# Patient Record
Sex: Female | Born: 1945 | Race: White | Hispanic: No | Marital: Married | Smoking: Former smoker
Health system: Southern US, Community
[De-identification: ages and names within clinical notes are randomized; demographics above are authoritative.]

## PROBLEM LIST (undated history)

## (undated) DIAGNOSIS — I10 Essential (primary) hypertension: Secondary | ICD-10-CM

## (undated) DIAGNOSIS — C50919 Malignant neoplasm of unspecified site of unspecified female breast: Secondary | ICD-10-CM

## (undated) DIAGNOSIS — Z860100 Personal history of colon polyps, unspecified: Secondary | ICD-10-CM

## (undated) DIAGNOSIS — M1990 Unspecified osteoarthritis, unspecified site: Secondary | ICD-10-CM

## (undated) DIAGNOSIS — K219 Gastro-esophageal reflux disease without esophagitis: Secondary | ICD-10-CM

## (undated) DIAGNOSIS — Z853 Personal history of malignant neoplasm of breast: Secondary | ICD-10-CM

## (undated) DIAGNOSIS — I7300 Raynaud's syndrome without gangrene: Secondary | ICD-10-CM

## (undated) DIAGNOSIS — M8580 Other specified disorders of bone density and structure, unspecified site: Secondary | ICD-10-CM

## (undated) DIAGNOSIS — C801 Malignant (primary) neoplasm, unspecified: Secondary | ICD-10-CM

## (undated) DIAGNOSIS — T7840XA Allergy, unspecified, initial encounter: Secondary | ICD-10-CM

## (undated) HISTORY — PX: DILATION AND CURETTAGE OF UTERUS: SHX78

## (undated) HISTORY — DX: Essential (primary) hypertension: I10

## (undated) HISTORY — DX: Allergy, unspecified, initial encounter: T78.40XA

## (undated) HISTORY — DX: Raynaud's syndrome without gangrene: I73.00

## (undated) HISTORY — PX: MASTECTOMY: SHX3

## (undated) HISTORY — DX: Malignant (primary) neoplasm, unspecified: C80.1

## (undated) HISTORY — PX: TUBAL LIGATION: SHX77

## (undated) HISTORY — DX: Personal history of colon polyps, unspecified: Z86.0100

## (undated) HISTORY — DX: Other specified disorders of bone density and structure, unspecified site: M85.80

## (undated) HISTORY — DX: Personal history of malignant neoplasm of breast: Z85.3

## (undated) HISTORY — PX: POLYPECTOMY: SHX149

## (undated) HISTORY — PX: COLONOSCOPY: SHX174

## (undated) HISTORY — DX: Malignant neoplasm of unspecified site of unspecified female breast: C50.919

## (undated) HISTORY — PX: BREAST SURGERY: SHX581

## (undated) HISTORY — DX: Personal history of colonic polyps: Z86.010

## (undated) HISTORY — DX: Gastro-esophageal reflux disease without esophagitis: K21.9

## (undated) HISTORY — DX: Unspecified osteoarthritis, unspecified site: M19.90

---

## 2003-07-07 LAB — FECAL OCCULT BLOOD, GUAIAC: Fecal Occult Blood: NEGATIVE

## 2004-03-23 ENCOUNTER — Encounter: Payer: Self-pay | Admitting: Internal Medicine

## 2004-07-27 ENCOUNTER — Ambulatory Visit: Payer: Self-pay | Admitting: Internal Medicine

## 2005-02-16 ENCOUNTER — Ambulatory Visit: Payer: Self-pay | Admitting: Internal Medicine

## 2005-03-23 LAB — HM DEXA SCAN

## 2005-04-03 ENCOUNTER — Ambulatory Visit: Payer: Self-pay | Admitting: Gastroenterology

## 2005-04-10 ENCOUNTER — Encounter: Payer: Self-pay | Admitting: Internal Medicine

## 2005-04-17 ENCOUNTER — Ambulatory Visit: Payer: Self-pay | Admitting: Gastroenterology

## 2005-04-17 ENCOUNTER — Encounter (INDEPENDENT_AMBULATORY_CARE_PROVIDER_SITE_OTHER): Payer: Self-pay | Admitting: Specialist

## 2005-10-02 ENCOUNTER — Ambulatory Visit: Payer: Self-pay | Admitting: Internal Medicine

## 2006-04-03 ENCOUNTER — Ambulatory Visit: Payer: Self-pay | Admitting: Internal Medicine

## 2006-10-03 ENCOUNTER — Ambulatory Visit: Payer: Self-pay | Admitting: Internal Medicine

## 2006-12-18 ENCOUNTER — Telehealth (INDEPENDENT_AMBULATORY_CARE_PROVIDER_SITE_OTHER): Payer: Self-pay | Admitting: *Deleted

## 2006-12-19 ENCOUNTER — Ambulatory Visit: Payer: Self-pay | Admitting: Family Medicine

## 2006-12-19 LAB — CONVERTED CEMR LAB
Bilirubin Urine: NEGATIVE
Glucose, Urine, Semiquant: NEGATIVE
Ketones, urine, test strip: NEGATIVE
Nitrite: POSITIVE
pH: 7.5

## 2006-12-24 ENCOUNTER — Telehealth (INDEPENDENT_AMBULATORY_CARE_PROVIDER_SITE_OTHER): Payer: Self-pay | Admitting: *Deleted

## 2006-12-31 ENCOUNTER — Encounter (INDEPENDENT_AMBULATORY_CARE_PROVIDER_SITE_OTHER): Payer: Self-pay | Admitting: Internal Medicine

## 2006-12-31 ENCOUNTER — Ambulatory Visit: Payer: Self-pay | Admitting: Family Medicine

## 2007-01-28 ENCOUNTER — Ambulatory Visit: Payer: Self-pay | Admitting: Internal Medicine

## 2007-01-28 LAB — CONVERTED CEMR LAB
Ketones, urine, test strip: NEGATIVE
Nitrite: POSITIVE

## 2007-03-26 ENCOUNTER — Encounter: Payer: Self-pay | Admitting: Internal Medicine

## 2007-03-26 DIAGNOSIS — Z853 Personal history of malignant neoplasm of breast: Secondary | ICD-10-CM

## 2007-03-26 DIAGNOSIS — M8580 Other specified disorders of bone density and structure, unspecified site: Secondary | ICD-10-CM

## 2007-03-26 DIAGNOSIS — I10 Essential (primary) hypertension: Secondary | ICD-10-CM

## 2007-03-26 DIAGNOSIS — J309 Allergic rhinitis, unspecified: Secondary | ICD-10-CM | POA: Insufficient documentation

## 2007-03-26 DIAGNOSIS — K219 Gastro-esophageal reflux disease without esophagitis: Secondary | ICD-10-CM

## 2007-03-26 DIAGNOSIS — D126 Benign neoplasm of colon, unspecified: Secondary | ICD-10-CM

## 2007-06-10 ENCOUNTER — Ambulatory Visit: Payer: Self-pay | Admitting: Internal Medicine

## 2007-06-11 ENCOUNTER — Encounter: Payer: Self-pay | Admitting: Internal Medicine

## 2007-06-16 ENCOUNTER — Encounter: Payer: Self-pay | Admitting: Internal Medicine

## 2007-06-16 ENCOUNTER — Encounter (INDEPENDENT_AMBULATORY_CARE_PROVIDER_SITE_OTHER): Payer: Self-pay | Admitting: *Deleted

## 2007-06-16 LAB — CONVERTED CEMR LAB: Pap Smear: NORMAL

## 2007-10-20 ENCOUNTER — Telehealth (INDEPENDENT_AMBULATORY_CARE_PROVIDER_SITE_OTHER): Payer: Self-pay | Admitting: *Deleted

## 2008-06-11 ENCOUNTER — Ambulatory Visit: Payer: Self-pay | Admitting: Internal Medicine

## 2008-06-11 DIAGNOSIS — M159 Polyosteoarthritis, unspecified: Secondary | ICD-10-CM | POA: Insufficient documentation

## 2008-06-12 ENCOUNTER — Encounter: Payer: Self-pay | Admitting: Internal Medicine

## 2008-07-19 ENCOUNTER — Ambulatory Visit: Payer: Self-pay | Admitting: Family Medicine

## 2008-07-19 DIAGNOSIS — J019 Acute sinusitis, unspecified: Secondary | ICD-10-CM

## 2008-11-08 ENCOUNTER — Telehealth: Payer: Self-pay | Admitting: Internal Medicine

## 2009-06-13 ENCOUNTER — Ambulatory Visit: Payer: Self-pay | Admitting: Internal Medicine

## 2009-06-13 DIAGNOSIS — R002 Palpitations: Secondary | ICD-10-CM

## 2009-09-27 ENCOUNTER — Ambulatory Visit: Payer: Self-pay | Admitting: Family Medicine

## 2010-03-02 ENCOUNTER — Encounter (INDEPENDENT_AMBULATORY_CARE_PROVIDER_SITE_OTHER): Payer: Self-pay | Admitting: *Deleted

## 2010-06-21 ENCOUNTER — Ambulatory Visit: Payer: Self-pay | Admitting: Internal Medicine

## 2010-08-20 LAB — CONVERTED CEMR LAB
ALT: 23 units/L (ref 0–35)
Bilirubin, Direct: 0.1 mg/dL (ref 0.0–0.3)
Creatinine, Ser: 0.6 mg/dL (ref 0.4–1.2)
Eosinophils Relative: 0.8 % (ref 0.0–5.0)
GFR calc non Af Amer: 106.95 mL/min (ref >60)
Glucose, Bld: 87 mg/dL (ref 70–99)
Lymphocytes Relative: 25.5 % (ref 12.0–46.0)
MCV: 98.3 fL (ref 78.0–100.0)
Monocytes Absolute: 0.5 10*3/uL (ref 0.1–1.0)
Monocytes Relative: 7.6 % (ref 3.0–12.0)
Neutrophils Relative %: 65.6 % (ref 43.0–77.0)
Phosphorus: 3.9 mg/dL (ref 2.3–4.6)
Platelets: 194 10*3/uL (ref 150.0–400.0)
Sodium: 139 meq/L (ref 135–145)
Total Bilirubin: 0.8 mg/dL (ref 0.3–1.2)
WBC: 6.3 10*3/uL (ref 4.5–10.5)

## 2010-08-23 NOTE — Assessment & Plan Note (Signed)
Summary: SINUS PROBLEMS   Vital Signs:  Patient profile:   65 year old female Height:      62 inches Weight:      120.8 pounds BMI:     22.17 Temp:     98.8 degrees F tympanic Pulse rate:   96 / minute Pulse rhythm:   regular BP sitting:   150 / 80  (left arm) Cuff size:   regular  Vitals Entered By: Benny Lennert CMA Duncan Dull) (September 27, 2009 2:17 PM)  History of Present Illness: Chief complaint sinus problem  65 year old female:  1 month  Feels like hurting a lot.  lot of nose congestion and now it has changed so that will have a lot of pain and congestion on one side.     Acute Visit History:      The patient complains of headache, nasal discharge, and sinus problems.  These symptoms began 4 weeks ago.  She denies chest pain, cough, earache, eye symptoms, fever, musculoskeletal symptoms, and nausea.        She complains of sinus pressure, nasal congestion, purulent drainage, and frontal headache.  The patient has had a past history of sinusitis.        Urine output has been normal.  She is tolerating clear liquids.        Allergies: 1)  ! Sulfa 2)  ! * Premarin Cream  Past History:  Past medical, surgical, family and social histories (including risk factors) reviewed, and no changes noted (except as noted below).  Past Medical History: Reviewed history from 06/11/2008 and no changes required. Allergic rhinitis Breast cancer, hx of GERD Hypertension Osteopenia Colonic polyps, hx of-adenomatous Osteoarthritis  Past Surgical History: Reviewed history from 03/26/2007 and no changes required. Mastectomy, right Mastectomy, left, prophylactic Tubal ligation D & C's DEXA- osteopenia 09/06  Family History: Reviewed history from 06/10/2007 and no changes required. Osteoporosis in Mom and sister Mom died in 2005--CVA, ?aspiration Dad died of lung cancer @48  1 sister  Social History: Reviewed history from 03/26/2007 and no changes required. Marital Status:  Married Children: 2 Occupation: Print production planner Former Smoker--quit 1998 Alcohol use-yes  Review of Systems       REVIEW OF SYSTEMS GEN: Acute illness details above. CV: No chest pain or SOB GI: No noted N or V Otherwise, pertinent positives and negatives are noted in the HPI.   Physical Exam  Additional Exam:  Gen: WDWN, NAD; alert,appropriate and cooperative throughout exam  HEENT: Normocephalic and atraumatic. Throat clear, w/o exudate, no LAD, R TM clear, L TM - good landmarks, No fluid present. rhinnorhea.  Left frontal and maxillary sinuses: Tender, max, mild Right frontal and maxillary sinuses: Tender, max, mild  Neck: No ant or post LAD  CV: RRR, No M/G/R  Pulm: Breathing comfortably in no resp distress. no w/c/r  Abd: S,NT,ND,+BS  Extr: no c/c/e  Psych: full affect, pleasant    Impression & Recommendations:  Problem # 1:  SINUSITIS - ACUTE-NOS (ICD-461.9) Assessment New Please see the patient instructions for a detailed list of plans and what was discussed with the patient.   Her updated medication list for this problem includes:    Amoxicillin 500 Mg Tabs (Amoxicillin) .Marland KitchenMarland KitchenMarland KitchenMarland Kitchen 3 tabs by mouth two times a day (high dose sinusitis dosing)  Complete Medication List: 1)  Aspir-low 81 Mg Tbec (Aspirin) .Marland Kitchen.. 1 once daily 2)  Prinzide 10-12.5 Mg Tabs (Lisinopril-hydrochlorothiazide) .Marland Kitchen.. 1 daily by mouth 3)  Multivitamins Tabs (Multiple vitamin) .Marland KitchenMarland KitchenMarland Kitchen  Take 1 tablet by mouth once a day 4)  Vitamin D 400 Unit Tabs (Cholecalciferol) .... Take 2  by mouth once a day 5)  Tums E-x 750 Mg Chew (Calcium carbonate antacid) .... 2 two times a day 6)  Macrodantin 100 Mg Caps (Nitrofurantoin macrocrystal) .... Take one after sex 7)  Ibuprofen 200 Mg Tabs (Ibuprofen) .... 2 tablet at bedtime 8)  Amoxicillin 500 Mg Tabs (Amoxicillin) .... 3 tabs by mouth two times a day (high dose sinusitis dosing)  Patient Instructions: 1)  SINUSITIS 2)  Sinuses are cavities in facial  skeleton that drain to nose. Impaired drainage and obstruction of sinus passages main cause. 3)  Treatment: 4)  1. Take all Antibiotics 5)  3. Steam inhalation 6)  4. Humidifier in room 7)  5. Frequent nasal saline irrigation 8)  6. Moist heat compresses to face 9)  7. Tylenol or Ibuprofen for pain and fever, follow directions on bottle.  Prescriptions: AMOXICILLIN 500 MG TABS (AMOXICILLIN) 3 tabs by mouth two times a day (high dose sinusitis dosing)  #60 x 0   Entered and Authorized by:   Hannah Beat MD   Signed by:   Hannah Beat MD on 09/27/2009   Method used:   Electronically to        [facility] [employer] [phone]* (retail)       [address] [place], [place]  [address]       Ph: [phone] or [phone]       Fax: [phone]   RxID:   [id number]   Current Allergies (reviewed today): ! SULFA ! * PREMARIN CREAM

## 2010-08-23 NOTE — Letter (Signed)
Summary: Colonoscopy Letter  [facility]  [address] [place], [place] [address]   Phone: [phone]  Fax: [phone]      March 02, 2010 MRN: [id number]   Karen Ross [address] [place], [place]  [address]   Dear Ms. Weygandt,   According to your medical record, it is time for you to schedule a Colonoscopy. The American Cancer Society recommends this procedure as a method to detect early colon cancer. Patients with a family history of colon cancer, or a personal history of colon polyps or inflammatory bowel disease are at increased risk.  This letter has beeen generated based on the recommendations made at the time of your procedure. If you feel that in your particular situation this may no longer apply, please contact our office.  Please call our office at [phone] to schedule this appointment or to update your records at your earliest convenience.  Thank you for cooperating with [place] to provide you with the very best care possible.   Sincerely,  Judie Petit T. Russella Dar, M.D.  [facility] Gastroenterology Division [phone]

## 2010-08-23 NOTE — Assessment & Plan Note (Signed)
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 Sulfa 2)  ! * Premarin Cream  Past History:  Past medical, surgical, family and social histories (including risk factors) reviewed for relevance to current acute and chronic problems.  Past Medical History: Reviewed history from 06/11/2008 and no changes required. Allergic rhinitis Breast cancer, hx of GERD Hypertension Osteopenia Colonic polyps, hx of-adenomatous Osteoarthritis  Past Surgical History: Reviewed history from 03/26/2007 and no changes required. Mastectomy, right Mastectomy, left, prophylactic Tubal ligation D & C's DEXA- osteopenia 09/06  Family History: Reviewed history from 06/10/2007 and no changes required. Osteoporosis in Mom and sister Mom died in 2005--CVA, ?aspiration Dad died of lung cancer @48  1 sister  Social History: Reviewed history from 03/26/2007 and no changes required. Marital Status: Married Children: 2 Occupation: Print production planner Former Smoker--quit 1998 Alcohol use-yes  Review of Systems General:  weight is stable generally sleeps okay wears seat belt. Eyes:  Complains of blurring; denies double vision and vision loss-1 eye; needs glasses for driving. ENT:  Denies decreased hearing and ringing in ears; teeth okay--regular with dentist. CV:  Complains of lightheadness and palpitations; denies chest pain or discomfort,  difficulty breathing at night, difficulty breathing while lying down, fainting, and shortness of breath with exertion; gets palpitations if she awakens suddenly No problems with exercise Occ mild dizzy sensation--brief only. Resp:  Denies cough and shortness of breath. GI:  Denies abdominal pain, bloody stools, change in bowel habits, dark tarry stools, indigestion, nausea, and vomiting. GU:  Denies dysuria and incontinence; no sexual problems. MS:  Complains of joint pain; denies joint swelling; still with right hip pain--esp if lying on that side--uses 2 ibuprofen at bedtime. Derm:  Denies lesion(s) and rash. Neuro:  Complains of numbness; denies headaches, tingling, and weakness; occ brief numbness in left hand. Psych:  Denies anxiety and depression; some frustrations with husband. Heme:  Denies abnormal bruising and enlarge lymph nodes. Allergy:  Complains of seasonal allergies and sneezing; some seasonal allergies--no meds.  Physical Exam  General:  alert and normal appearance.   Eyes:  pupils equal, pupils round, pupils reactive to light, and no optic disk abnormalities.   Ears:  R ear normal and L ear normal.   Mouth:  no erythema, no exudates, and no lesions.   Neck:  supple, no masses, no thyromegaly, no carotid bruits, and no cervical lymphadenopathy.   Breasts:  no exam since no breast tissue Lungs:  normal respiratory effort, no intercostal retractions, no accessory muscle use, and normal breath sounds.   Heart:  normal rate, regular rhythm, no murmur, and no gallop.   Abdomen:  soft, non-tender, and no masses.   Msk:  no joint tenderness and no joint swelling.   Pulses:  1+ in feet Extremities:  no edema Neurologic:  alert & oriented X3, strength normal in  all extremities, and gait normal.   Skin:  no rashes and no suspicious lesions.   Axillary Nodes:  No palpable lymphadenopathy Psych:  normally interactive, good eye contact, not anxious appearing, and not depressed  appearing.     Impression & Recommendations:  Problem # 1:  PREVENTIVE HEALTH CARE (ICD-V70.0) Assessment Comment Only generally healthy some stress with husband---she doesn't have persistent mood issues or think counselling will help her due for colon--she will call next year for appt--has already gotten letter  Problem # 2:  HYPERTENSION (ICD-401.9) Assessment: Unchanged  always high here ??white coat she will check in store increase if abnormal urine microal  Her updated medication list for this problem includes:    Prinzide 10-12.5 Mg Tabs (Lisinopril-hydrochlorothiazide) .Marland Kitchen... 1 daily by mouth  BP today: 160/100 Prior BP: 150/80 (09/27/2009)  Labs Reviewed: K+: 4.1 (06/13/2009) Creat: : 0.6 (06/13/2009)     Orders: Venipuncture ([address]) Specimen Handling (60454) T-Renal Function Panel [phone]) T-Hepatic Function [phone]) T-TSH (57846-96295) T-Urine Microalbumin w/creat. ratio [phone]) T-CBC w/Diff (53664-40347)  Problem # 3:  OSTEOPENIA (ICD-733.90) Assessment: Unchanged uses tums for calcium  Her updated medication list for this problem includes:    Vitamin D 400 Unit Tabs (Cholecalciferol) .Marland Kitchen... Take 2  by mouth once a day  Complete Medication List: 1)  Prinzide 10-12.5 Mg Tabs (Lisinopril-hydrochlorothiazide) .Marland Kitchen.. 1 daily by mouth 2)  Aspir-low 81 Mg Tbec (Aspirin) .Marland Kitchen.. 1 once daily 3)  Multivitamins Tabs (Multiple vitamin) .... Take 1 tablet by mouth once a day 4)  Vitamin D 400 Unit Tabs (Cholecalciferol) .... Take 2  by mouth once a day 5)  Tums E-x 750 Mg Chew (Calcium carbonate antacid) .... 2 two times a day 6)  Ibuprofen 200 Mg Tabs (Ibuprofen) .... 2 tablet at bedtime  Other Orders: Pneumococcal Vaccine (42595) Admin 1st Vaccine (63875) Flu Vaccine 20yrs + MEDICARE PATIENTS (I4332) Administration Flu vaccine - MCR (R5188)  Patient Instructions: 1)  Please call for your colonoscopy appt after the New Year as you had  planned 2)  Please check your blood pressure every 1-2 months and write it down 3)  Please schedule a follow-up appointment in 6 months .    Orders Added: 1)  Est. Patient 65& > [99397] 2)  Venipuncture [41660] 3)  Pneumococcal Vaccine [90732] 4)  Admin 1st Vaccine [90471] 5)  Flu Vaccine 20yrs + MEDICARE PATIENTS [Q2039] 6)  Administration Flu vaccine - MCR [G0008] 7)  Specimen Handling [99000] 8)  T-Renal Function Panel [80069-22950] 9)  T-Hepatic Function [80076-22960] 10)  T-TSH [63016-01093] 11)  T-Urine Microalbumin w/creat. ratio [[id number]] 12)  T-CBC w/Diff [23557-32202]   Immunizations Administered:  Pneumonia Vaccine:    Vaccine Type: Pneumovax (Medicare)    Site: left deltoid    Mfr: Merck    Dose: 0.5 ml    Route: IM    Given by: Delilah Shan CMA (AAMA)    Exp. Date: 11/17/2011    Lot #: [id number]    VIS given: 06/27/09 version given June 21, 2010.   Immunizations Administered:  Pneumonia Vaccine:    Vaccine Type: Pneumovax (Medicare)    Site: left deltoid    Mfr: Merck    Dose: 0.5 ml    Route: IM    Given by: Delilah Shan CMA (AAMA)    Exp. Date: 11/17/2011    Lot #: [id number]    VIS given: 06/27/09 version given June 21, 2010.  Current Allergies (reviewed today): ! SULFA ! * PREMARIN CREAM   Flu Vaccine  Consent Questions     Do you have a history of severe allergic reactions to this vaccine? no    Any prior history of allergic reactions to egg and/or gelatin? no    Do you have a sensitivity to the preservative Thimersol? no    Do you have a past history of Guillan-Barre Syndrome? no    Do you currently have an acute febrile illness? no    Have you ever had a severe reaction to latex? no    Vaccine information given and explained to patient? yes    Are you currently pregnant? no    Lot Number:[id number]   Exp Date:01/20/2011   Site Given  Left Deltoid IMmedflu Lugene Fuquay CMA (AAMA)  June 21, 2010 10:34 AM

## 2010-08-23 NOTE — Letter (Signed)
Summary: Nature conservation officer [employer] Wellness Visit Questionnaire   [facility] [employer] Annual Wellness Visit Questionnaire   Imported By: Beau Fanny 06/21/2010 15:07:04  _____________________________________________________________________  External Attachment:    Type:   Image     Comment:   External Document

## 2010-09-21 ENCOUNTER — Encounter (INDEPENDENT_AMBULATORY_CARE_PROVIDER_SITE_OTHER): Payer: Self-pay | Admitting: *Deleted

## 2010-09-28 NOTE — Letter (Signed)
Summary: Pre Visit Letter Revised  [facility]  [address] [place], [place] [address]   Phone: [phone]  Fax: [phone]        09/21/2010 MRN: [id number] CATORI PANOZZO [address] [place], [place]  [address]             Procedure Date:  November 15, 2010             recall col Dr Russella Dar   Welcome to the Gastroenterology Division at [facility].    You are scheduled to see a nurse for your pre-procedure visit on November 01, 2010 at 11:00am on the 3rd floor at [facility], [address]  We ask that you try to arrive at our office 15 minutes prior to your appointment time to allow for check-in.  Please take a minute to review the attached form.  If you answer "Yes" to one or more of the questions on the first page, we ask that you call the person listed at your earliest opportunity.  If you answer "No" to all of the questions, please complete the rest of the form and bring it to your appointment.    Your nurse visit will consist of discussing your medical and surgical history, your immediate family medical history, and your medications.   If you are unable to list all of your medications on the form, please bring the medication bottles to your appointment and we will list them.  We will need to be aware of both prescribed and over the counter drugs.  We will need to know exact dosage information as well.    Please be prepared to read and sign documents such as consent forms, a financial agreement, and acknowledgement forms.  If necessary, and with your consent, a friend or relative is welcome to sit-in on the nurse visit with you.  Please bring your insurance card so that we may make a copy of it.  If your insurance requires a referral to see a specialist, please bring your referral form from your primary care physician.  No co-pay is required for this nurse visit.     If you cannot keep your appointment, please call [phone] to cancel or reschedule  prior to your appointment date.  This allows [place] the opportunity to schedule an appointment for another patient in need of care.    Thank you for choosing  Gastroenterology for your medical needs.  We appreciate the opportunity to care for you.  Please visit [place] at our website  to learn more about our practice.  Sincerely, The Gastroenterology Division

## 2010-11-01 ENCOUNTER — Ambulatory Visit (AMBULATORY_SURGERY_CENTER): Payer: 59 | Admitting: *Deleted

## 2010-11-01 VITALS — Ht 62.0 in | Wt 117.2 lb

## 2010-11-01 DIAGNOSIS — Z86010 Personal history of colonic polyps: Secondary | ICD-10-CM

## 2010-11-01 MED ORDER — PEG-KCL-NACL-NASULF-NA ASC-C 100 G PO SOLR: ORAL | Status: DC

## 2010-11-08 ENCOUNTER — Telehealth: Payer: Self-pay | Admitting: Gastroenterology

## 2010-11-14 ENCOUNTER — Encounter: Payer: Self-pay | Admitting: Gastroenterology

## 2010-11-15 ENCOUNTER — Encounter: Payer: Self-pay | Admitting: Gastroenterology

## 2010-11-15 ENCOUNTER — Ambulatory Visit (AMBULATORY_SURGERY_CENTER): Payer: 59 | Admitting: Gastroenterology

## 2010-11-15 DIAGNOSIS — K5730 Diverticulosis of large intestine without perforation or abscess without bleeding: Secondary | ICD-10-CM

## 2010-11-15 DIAGNOSIS — D126 Benign neoplasm of colon, unspecified: Secondary | ICD-10-CM

## 2010-11-15 DIAGNOSIS — Z86010 Personal history of colonic polyps: Secondary | ICD-10-CM

## 2010-11-15 DIAGNOSIS — Z1211 Encounter for screening for malignant neoplasm of colon: Secondary | ICD-10-CM

## 2010-11-15 LAB — HM COLONOSCOPY

## 2010-11-15 MED ORDER — SODIUM CHLORIDE 0.9 % IV SOLN: 500.0000 mL | INTRAVENOUS | Status: DC

## 2010-11-15 NOTE — Patient Instructions (Signed)
Discharged instructions given with verbal understanding. Handouts on polyps, diverticulosis, and hemorrhoids given. Resume previous medications. 

## 2010-11-16 ENCOUNTER — Telehealth: Payer: Self-pay

## 2010-11-16 NOTE — Telephone Encounter (Signed)

## 2010-11-20 ENCOUNTER — Encounter: Payer: Self-pay | Admitting: Gastroenterology

## 2010-12-05 NOTE — Assessment & Plan Note (Signed)
[facility]                                 ON-CALL NOTE   NAME:MILLERBeyza, Bellino                         MRN:          [id number]  DATE:12/17/2006                            DOB:          November 16, 1945    TIME:  5:41 p.m.   PHONE NUMBER:  [phone].   REGULAR DOCTOR:  Dr. Alphonsus Sias.   CHIEF COMPLAINT:  UTI.   The patient states she used to have a lot of bladder infections in the  past.  She had a prophylactic antibiotic that she would have filled now  and then, but she thinks it may have been as much as 8 years ago that  she has had to have that filled.  She called her pharmacy to try to find  out what it was and it was so long ago they could not tell her.  She  said she started having a little bit of burning on urination yesterday;  it got worse today and now she has some blood in her urine, which is  looking pink and milky in color.  She denies fever, nausea or vomiting.  She thinks she might have brought this on by having intercourse  recently, which does bring on her urinary tract infection occasionally.  She has drank a lot of water with no improvement in her symptoms.  I  told her given the fact that her symptoms are so severe and she does  have blood in her urine, to go on to urgent care or the emergency room  and get it checked out now and that is what she is going to do.     Marne A. Tower, MD  Electronically Signed    MAT/MedQ  DD: 12/17/2006  DT: 12/18/2006  Job #: [id number]   cc:   Karie Schwalbe, MD

## 2010-12-14 NOTE — Telephone Encounter (Signed)
Patient has had procedure.

## 2010-12-20 ENCOUNTER — Ambulatory Visit: Payer: Self-pay | Admitting: Internal Medicine

## 2010-12-22 ENCOUNTER — Ambulatory Visit: Payer: Self-pay | Admitting: Internal Medicine

## 2010-12-28 ENCOUNTER — Encounter: Payer: Self-pay | Admitting: Internal Medicine

## 2010-12-29 ENCOUNTER — Encounter: Payer: Self-pay | Admitting: Internal Medicine

## 2010-12-29 ENCOUNTER — Ambulatory Visit (INDEPENDENT_AMBULATORY_CARE_PROVIDER_SITE_OTHER): Payer: 59 | Admitting: Internal Medicine

## 2010-12-29 VITALS — BP 152/80 | HR 75 | Temp 98.8°F | Ht 62.0 in | Wt 117.0 lb

## 2010-12-29 DIAGNOSIS — M899 Disorder of bone, unspecified: Secondary | ICD-10-CM

## 2010-12-29 DIAGNOSIS — I10 Essential (primary) hypertension: Secondary | ICD-10-CM

## 2010-12-29 DIAGNOSIS — M1990 Unspecified osteoarthritis, unspecified site: Secondary | ICD-10-CM

## 2010-12-29 MED ORDER — LISINOPRIL-HYDROCHLOROTHIAZIDE 20-12.5 MG PO TABS: 1.0000 | ORAL_TABLET | Freq: Every day | ORAL | Status: DC

## 2010-12-29 NOTE — Assessment & Plan Note (Signed)
On calcium and vitamin D Discussed daily walking or other exercise

## 2010-12-29 NOTE — Assessment & Plan Note (Signed)
Does fine with nightly ibuprofen Discussed every other day with aspirin then

## 2010-12-29 NOTE — Assessment & Plan Note (Signed)
BP Readings from Last 3 Encounters:  12/29/10 152/80  11/15/10 195/90  06/21/10 160/100   Has been borderline at home Will increase the lisinopril

## 2010-12-29 NOTE — Progress Notes (Signed)
Subjective:    Patient ID: Karen Ross, female    DOB: 07-Jan-1946, 65 y.o.   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 aspirin 81 MG EC tablet Take 81 mg by mouth daily.        . calcium carbonate (TUMS EX) 750 MG chewable tablet Chew 2 tablets by mouth 2 (two) times daily.        . Ergocalciferol (VITAMIN D2) 400 UNITS TABS Take 400 Units by mouth daily. Takes 2 daily       . ibuprofen (ADVIL,MOTRIN) 200 MG tablet Take 200 mg by mouth at bedtime.        Marland Kitchen lisinopril-hydrochlorothiazide (PRINZIDE,ZESTORETIC) 10-12.5 MG per tablet Take 1 tablet by mouth daily.        . Multiple Vitamins-Minerals (MULTIVITAMIN WITH MINERALS) tablet Take 1 tablet by mouth daily.        Marland Kitchen DISCONTD: peg 3350 powder (MOVIPREP) 100 G SOLR Movi prep as directed  1 kit  0   Current Facility-Administered Medications on File Prior to Visit  Medication Dose Route Frequency Provider Last Rate Last Dose  . DISCONTD: 0.9 %  sodium chloride infusion  500 mL Intravenous Continuous Meryl Dare, MD,FACG       Past Medical History  Diagnosis Date  . Arthritis   . Cancer   . Hypertension   . Breast cancer     in 1977  . Allergy   . History of breast cancer   . GERD (gastroesophageal reflux disease)   . Osteopenia   . Hx of colonic polyps     Past Surgical History  Procedure Date  . Colonoscopy   . Polypectomy   . Mastectomy     radial rt.side/tissue removal on left  . Tubal ligation   . Dilation and curettage of uterus     Family History  Problem Relation Age of Onset  . Osteoporosis Mother   . Osteoporosis Sister     History   Social History  .  Marital Status: Married    Spouse Name: N/A    Number of Children: 2  . 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 Smoking status: Former Smoker    Quit date: 08/02/1996  . Smokeless tobacco: Never Used  . Alcohol Use: 7.5 oz/week    15 drink(s) per week  . Drug Use: No  . Sexually Active: Not on file   Other Topics Concern  . Not on file   Social History Narrative  . No narrative on file   Review of Systems Chronic aching in hands---uses ibuprofen 400mg  at bedtime Sleeps okay in general--occ night awakening Not depressed but frustrated with husband at times    Objective:   Physical Exam  Constitutional: She appears well-developed and well-nourished. No distress.  Neck: Normal range of motion. Neck supple. No thyromegaly present.  Cardiovascular: Normal rate, regular rhythm and normal heart sounds.  Exam reveals no gallop.   No murmur heard. Pulmonary/Chest: Effort normal and breath sounds normal. No respiratory distress. She has no wheezes. She has no rales.  Abdominal: Soft. She exhibits  no mass. There is no tenderness.  Musculoskeletal: Normal range of motion. She exhibits no edema and no tenderness.  Lymphadenopathy:    She has no cervical adenopathy.  Psychiatric: She has a normal mood and affect. Her behavior is normal. Judgment and thought content normal.          Assessment & Plan:

## 2010-12-29 NOTE — Patient Instructions (Signed)
Please set up blood work in about 4-6 weeks (met B--401.9)

## 2011-01-31 ENCOUNTER — Other Ambulatory Visit: Payer: 59

## 2011-02-06 ENCOUNTER — Other Ambulatory Visit: Payer: Self-pay | Admitting: Internal Medicine

## 2011-02-06 DIAGNOSIS — I10 Essential (primary) hypertension: Secondary | ICD-10-CM

## 2011-02-07 ENCOUNTER — Other Ambulatory Visit (INDEPENDENT_AMBULATORY_CARE_PROVIDER_SITE_OTHER): Payer: 59 | Admitting: Internal Medicine

## 2011-02-07 ENCOUNTER — Ambulatory Visit (INDEPENDENT_AMBULATORY_CARE_PROVIDER_SITE_OTHER): Payer: 59 | Admitting: Internal Medicine

## 2011-02-07 DIAGNOSIS — Z23 Encounter for immunization: Secondary | ICD-10-CM

## 2011-02-07 DIAGNOSIS — Z2911 Encounter for prophylactic immunotherapy for respiratory syncytial virus (RSV): Secondary | ICD-10-CM

## 2011-02-07 DIAGNOSIS — I10 Essential (primary) hypertension: Secondary | ICD-10-CM

## 2011-02-07 NOTE — Progress Notes (Signed)
  Subjective:    Patient ID: Karen Ross, female    DOB: 12/18/1945, 65 y.o.   MRN: [id number]  HPI  Here for zostavax  Review of Systems     Objective:   Physical Exam        Assessment & Plan:

## 2011-05-25 ENCOUNTER — Encounter: Payer: Self-pay | Admitting: Internal Medicine

## 2011-05-25 ENCOUNTER — Ambulatory Visit (INDEPENDENT_AMBULATORY_CARE_PROVIDER_SITE_OTHER): Payer: 59 | Admitting: Internal Medicine

## 2011-05-25 VITALS — BP 174/100 | HR 89 | Temp 98.5°F | Ht 62.0 in | Wt 117.0 lb

## 2011-05-25 DIAGNOSIS — Z0000 Encounter for general adult medical examination without abnormal findings: Secondary | ICD-10-CM

## 2011-05-25 DIAGNOSIS — I10 Essential (primary) hypertension: Secondary | ICD-10-CM

## 2011-05-25 DIAGNOSIS — Z23 Encounter for immunization: Secondary | ICD-10-CM

## 2011-05-25 DIAGNOSIS — K219 Gastro-esophageal reflux disease without esophagitis: Secondary | ICD-10-CM

## 2011-05-25 MED ORDER — LISINOPRIL-HYDROCHLOROTHIAZIDE 20-12.5 MG PO TABS: 2.0000 | ORAL_TABLET | Freq: Every day | ORAL | Status: DC

## 2011-05-25 NOTE — Assessment & Plan Note (Signed)
Discussed using H2blocker before spicy food

## 2011-05-25 NOTE — Assessment & Plan Note (Signed)
BP Readings from Last 3 Encounters:  05/25/11 174/100  12/29/10 152/80  11/15/10 195/90   Poor control Will double her med See back in 6 weeks

## 2011-05-25 NOTE — Progress Notes (Signed)
Addended by: Alvina Chou on: 05/25/2011 11:16 AM   Modules accepted: Orders

## 2011-05-25 NOTE — Patient Instructions (Signed)
Please take 2 of your blood pressure pills daily. It is fine to take them at the same time

## 2011-05-25 NOTE — Assessment & Plan Note (Signed)
Pap done today---last one if normal Breast implant on left---no obvious breast tissue Discussed fitness Got flu shot

## 2011-05-25 NOTE — Progress Notes (Signed)
Subjective:    Patient ID: Karen Ross, female    DOB: 1946/04/15, 65 y.o.   MRN: [id number]  HPI Doing well No concerns except knows BP has been up a little Hasn't been checking but can feel it a little  Had colonoscopy Due for last Pap today Still has a little left residual breast tissue---not enough for mammo  Current Outpatient Prescriptions on File Prior to Visit  Medication Sig Dispense Refill  . aspirin 81 MG EC tablet Take 81 mg by mouth daily.        . Ergocalciferol (VITAMIN D2) 400 UNITS TABS Take 400 Units by mouth daily. Takes 2 daily       . lisinopril-hydrochlorothiazide (PRINZIDE,ZESTORETIC) 20-12.5 MG per tablet Take 1 tablet by mouth daily.  90 tablet  3  . Multiple Vitamins-Minerals (MULTIVITAMIN WITH MINERALS) tablet Take 1 tablet by mouth daily.          Allergies  Allergen Reactions  . Sulfonamide Derivatives     REACTION: UNSPECIFIED    Past Medical History  Diagnosis Date  . Arthritis   . Cancer   . Hypertension   . Breast cancer     in 1977  . Allergy   . History of breast cancer   . GERD (gastroesophageal reflux disease)   . Osteopenia   . Hx of colonic polyps     Past Surgical History  Procedure Date  . Colonoscopy   . Polypectomy   . Mastectomy     radial rt.side/tissue removal on left  . Tubal ligation   . Dilation and curettage of uterus     Family History  Problem Relation Age of Onset  . Osteoporosis Mother   . Osteoporosis Sister     History   Social History  . Marital Status: Married    Spouse Name: N/A    Number of Children: 2  . 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 Smoking status: Former Smoker    Quit date: 08/02/1996  . Smokeless tobacco: Never Used  . Alcohol Use: 7.5 oz/week    15 drink(s) per week  . Drug Use: No  . Sexually Active: Not on file   Other Topics Concern  . Not on file   Social History Narrative  . No narrative on  file   Review of Systems  Constitutional: Negative for fatigue and unexpected weight change.       Wears seat belt  HENT: Positive for congestion, rhinorrhea and dental problem. Negative for hearing loss and tinnitus.        Did have ears cleared out by ENT Needs dental work for cracked teeth  Eyes: Negative for visual disturbance.       No diplopia or unilateral vision loss  Respiratory: Negative for cough, chest tightness and shortness of breath.   Cardiovascular: Positive for palpitations and leg swelling. Negative for chest pain.       Occ left foot swelling Feels like her heart is beating hard at times---can hear it when left ear is covered  Gastrointestinal: Negative for nausea, vomiting, abdominal pain, constipation and blood in stool.       Occ heartburn with spicy foods--uses tums  Genitourinary: Positive for dyspareunia. Negative for dysuria and difficulty urinating.       Still with some dryness--lubricants make it worse  Musculoskeletal: Positive for back pain and arthralgias. Negative for joint swelling.  Occ low back pain and hand pain  Skin: Negative for rash.       No suspicious areas  Neurological: Positive for numbness and headaches. Negative for dizziness, syncope and weakness.       Occ left hand tingling after awakening--goes away quickly occ headaches---thinks it is sinus  Hematological: Negative for adenopathy. Bruises/bleeds easily.  Psychiatric/Behavioral: Positive for sleep disturbance. Negative for dysphoric mood. The patient is not nervous/anxious.        Has sporadic bad nights for sleep---no general daytime somnolence       Objective:   Physical Exam  Constitutional: She is oriented to person, place, and time. She appears well-developed and well-nourished. No distress.  HENT:  Head: Normocephalic and atraumatic.  Right Ear: External ear normal.  Left Ear: External ear normal.  Mouth/Throat: Oropharynx is clear and moist. No oropharyngeal  exudate.       TMs normal  Eyes: Conjunctivae and EOM are normal. Pupils are equal, round, and reactive to light.  Neck: Normal range of motion. Neck supple. No thyromegaly present.  Cardiovascular: Normal rate, regular rhythm, normal heart sounds and intact distal pulses.  Exam reveals no gallop.   No murmur heard. Pulmonary/Chest: Effort normal and breath sounds normal. No respiratory distress. She has no wheezes. She has no rales.  Abdominal: Soft. She exhibits no mass. There is no tenderness.  Genitourinary:       Slight residual left breast tissue without mass--insert is firm  Vaginal stenosis---normal cervix, pap done Limited bimanual (1 finger) was negative  Musculoskeletal: Normal range of motion. She exhibits no edema and no tenderness.  Lymphadenopathy:    She has no cervical adenopathy.    She has no axillary adenopathy.  Neurological: She is alert and oriented to person, place, and time.  Skin: Skin is warm. No rash noted.  Psychiatric: She has a normal mood and affect. Her behavior is normal. Judgment and thought content normal.          Assessment & Plan:

## 2011-05-31 ENCOUNTER — Encounter: Payer: Self-pay | Admitting: Internal Medicine

## 2011-06-04 ENCOUNTER — Encounter: Payer: Self-pay | Admitting: *Deleted

## 2011-06-05 ENCOUNTER — Encounter: Payer: Self-pay | Admitting: *Deleted

## 2011-06-11 ENCOUNTER — Telehealth: Payer: Self-pay | Admitting: *Deleted

## 2011-06-11 NOTE — Telephone Encounter (Signed)
If congestion is mostly in head, I am not a big fan of guaifenisin Can use vick's vaporub for congestion safely I also recommend acetaminophen

## 2011-06-11 NOTE — Telephone Encounter (Signed)
Pt asks what she can take for cold and sinus symptoms, she takes BP medicine.  Advised mucous relief expectorant is good for cough and chest congestion, use plain, dont take anything with a decongestant.

## 2011-06-11 NOTE — Telephone Encounter (Signed)
Spoke with patient and advised results   

## 2011-06-15 ENCOUNTER — Encounter: Payer: Self-pay | Admitting: Internal Medicine

## 2011-06-25 ENCOUNTER — Telehealth: Payer: Self-pay | Admitting: Internal Medicine

## 2011-06-26 ENCOUNTER — Telehealth: Payer: Self-pay | Admitting: *Deleted

## 2011-06-26 NOTE — Telephone Encounter (Signed)
Message copied by Sueanne Margarita on Tue Jun 26, 2011  3:48 PM ------      Message from: Tillman Abide I      Created: Mon Jun 18, 2011  1:59 PM       Please call      The liver tests are still elevated but have come down quite a bit      The hepatitis tests are all negative so no evidence of a viral hepatitis      We will just plan to repeat the liver levels at her next visit

## 2011-06-26 NOTE — Telephone Encounter (Signed)
.  left message to have patient return my call.  

## 2011-06-27 ENCOUNTER — Telehealth: Payer: Self-pay | Admitting: Internal Medicine

## 2011-06-27 NOTE — Telephone Encounter (Signed)
Informed patient of her lab results.

## 2011-07-09 ENCOUNTER — Encounter: Payer: Self-pay | Admitting: Internal Medicine

## 2011-07-09 ENCOUNTER — Ambulatory Visit (INDEPENDENT_AMBULATORY_CARE_PROVIDER_SITE_OTHER): Payer: 59 | Admitting: Internal Medicine

## 2011-07-09 VITALS — BP 160/90 | HR 80 | Temp 100.7°F | Wt 114.8 lb

## 2011-07-09 DIAGNOSIS — I10 Essential (primary) hypertension: Secondary | ICD-10-CM

## 2011-07-09 DIAGNOSIS — R6889 Other general symptoms and signs: Secondary | ICD-10-CM | POA: Insufficient documentation

## 2011-07-09 NOTE — Assessment & Plan Note (Signed)
Probably attentuated flu since she got the vaccine Other viral illness otherwise Discussed supportive care---ibuprofen for fever and aches, etc

## 2011-07-09 NOTE — Progress Notes (Signed)
Subjective:    Patient ID: Karen Ross, female    DOB: 1946-06-12, 65 y.o.   MRN: [id number]  HPI Has felt terrible since yesterday Fever Lots of cough--dry mostly, occ slight mucus Aching everywhere Did take flu shot  Some headache No ear pain Some sore throat from the coughing  No BP checks at home No problems with the double dose of meds Some increased thirst at times No throat problems until yesterday No chest pain No SOB  occ left arm numbness if she sleeps on it Aches at times also  Current Outpatient Prescriptions on File Prior to Visit  Medication Sig Dispense Refill  . aspirin 81 MG EC tablet Take 81 mg by mouth daily.        . Ergocalciferol (VITAMIN D2) 400 UNITS TABS Take 400 Units by mouth daily. Takes 2 daily       . lisinopril-hydrochlorothiazide (PRINZIDE,ZESTORETIC) 20-12.5 MG per tablet Take 2 tablets by mouth daily.  90 tablet  6  . Multiple Vitamins-Minerals (MULTIVITAMIN WITH MINERALS) tablet Take 1 tablet by mouth daily.          Allergies  Allergen Reactions  . Sulfonamide Derivatives     REACTION: UNSPECIFIED    Past Medical History  Diagnosis Date  . Arthritis   . Cancer   . Hypertension   . Breast cancer     in 1977  . Allergy   . History of breast cancer   . GERD (gastroesophageal reflux disease)   . Osteopenia   . Hx of colonic polyps     Past Surgical History  Procedure Date  . Colonoscopy   . Polypectomy   . Mastectomy     radial rt.side/tissue removal on left  . Tubal ligation   . Dilation and curettage of uterus     Family History  Problem Relation Age of Onset  . Osteoporosis Mother   . Osteoporosis Sister     History   Social History  . Marital Status: Married    Spouse Name: N/A    Number of Children: 2  . 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 Smoking status: Former Smoker    Quit date: 08/02/1996  . Smokeless tobacco: Never Used  .  Alcohol Use: 7.5 oz/week    15 drink(s) per week  . Drug Use: No  . Sexually Active: Not on file   Other Topics Concern  . Not on file   Social History Narrative  . No narrative on file      Review of Systems No rash No vomiting or diarrhea Able to eat but taste is off    Objective:   Physical Exam  Constitutional: She appears well-developed and well-nourished. No distress.       Appears mildly under the weather but no distress  HENT:  Right Ear: External ear normal.  Left Ear: External ear normal.  Mouth/Throat: Oropharynx is clear and moist. No oropharyngeal exudate.       Moderate nasal congestion and inflammation No sinus tenderness  Neck: Normal range of motion. Neck supple. No thyromegaly present.  Cardiovascular: Normal rate, regular rhythm and normal heart sounds.  Exam reveals no gallop.   No murmur heard. Pulmonary/Chest: Effort normal and breath sounds normal. No respiratory distress. She has no wheezes. She has no rales.  Musculoskeletal: She exhibits no edema and no tenderness.  Lymphadenopathy:    She has no cervical  adenopathy.  Psychiatric: She has a normal mood and affect. Her behavior is normal. Judgment and thought content normal.          Assessment & Plan:

## 2011-07-09 NOTE — Assessment & Plan Note (Signed)
BP Readings from Last 3 Encounters:  07/09/11 160/90  05/25/11 174/100  12/29/10 152/80   Repeat by me on right 162/82 Clearly is better Not fair to judge when she has flu-like illness Will check labs and recheck in 3 months

## 2011-07-10 LAB — BASIC METABOLIC PANEL
BUN/Creatinine Ratio: 14 (ref 11–26)
Chloride: 92 mmol/L — ABNORMAL LOW (ref 97–108)
GFR calc Af Amer: 111 mL/min/{1.73_m2} (ref >59)
GFR calc non Af Amer: 96 mL/min/{1.73_m2} (ref >59)
Glucose: 131 mg/dL — ABNORMAL HIGH (ref 65–99)
Potassium: 3.9 mmol/L (ref 3.5–5.2)
Sodium: 130 mmol/L — ABNORMAL LOW (ref 134–144)

## 2011-07-12 ENCOUNTER — Telehealth: Payer: Self-pay | Admitting: Internal Medicine

## 2011-07-12 MED ORDER — AMOXICILLIN 500 MG PO TABS: 500.0000 mg | ORAL_TABLET | Freq: Two times a day (BID) | ORAL | Status: DC

## 2011-07-12 NOTE — Telephone Encounter (Signed)
Spoke with patient and advised results rx sent to pharmacy by e-script  

## 2011-07-12 NOTE — Telephone Encounter (Signed)
Patient was seen in office on 12.17.12 and was told she had a mild case of the flu and now it has become progressively worse.  Fever, congestion, cough with dark brownish and grayish and sore throat she is up all night.  Wanted to if you could Rx her something for congestion and cough.

## 2011-07-12 NOTE — Telephone Encounter (Signed)
Okay to send Rx for amoxicillin 500mg  2 tabs bid x 10 days #40 x 0

## 2011-07-25 ENCOUNTER — Telehealth: Payer: Self-pay | Admitting: Internal Medicine

## 2011-07-25 NOTE — Telephone Encounter (Signed)
Patient was dx with the flu on the 17th and she was Rx Amoxicillan and it took care of the congestion.  But her complaint is her legs and neck is aches.  She states sometimes she can't get out of bed and wanted to know what she can do other than take Ibuprofen.

## 2011-07-25 NOTE — Telephone Encounter (Signed)
Spoke with patient and advised results   

## 2011-07-25 NOTE — Telephone Encounter (Signed)
Can take tylenol or ibuprofen (could try alleve instead of ibuprofen if she wants) Unfortunately there is not much else that can help with the aches other than time

## 2011-08-01 NOTE — Telephone Encounter (Signed)
Opened in error by Pam Clement 

## 2011-08-09 ENCOUNTER — Ambulatory Visit (INDEPENDENT_AMBULATORY_CARE_PROVIDER_SITE_OTHER): Payer: 59 | Admitting: Internal Medicine

## 2011-08-09 ENCOUNTER — Encounter: Payer: Self-pay | Admitting: Internal Medicine

## 2011-08-09 VITALS — BP 128/70 | HR 62 | Temp 98.5°F | Ht 62.0 in | Wt 109.0 lb

## 2011-08-09 DIAGNOSIS — R609 Edema, unspecified: Secondary | ICD-10-CM | POA: Insufficient documentation

## 2011-08-09 DIAGNOSIS — I10 Essential (primary) hypertension: Secondary | ICD-10-CM

## 2011-08-09 DIAGNOSIS — R21 Rash and other nonspecific skin eruption: Secondary | ICD-10-CM

## 2011-08-09 MED ORDER — FUROSEMIDE 20 MG PO TABS: 20.0000 mg | ORAL_TABLET | Freq: Two times a day (BID) | ORAL | Status: DC

## 2011-08-09 NOTE — Progress Notes (Signed)
Subjective:    Patient ID: Karen Ross, female    DOB: March 30, 1946, 66 y.o.   MRN: [id number]  HPI Finally got over the respiratory illness Actually was in bed for a prolonged time She thinks amoxil helped  Now with swollen ankles Started a week ago or so Right first---now both No recent travel No increase in salt intake No injuries May have been just sitting and less active when sick---but this ended when the swelling began Has had considerable pain--mostly at end of the day  No fever No redness or inflammation  Has rash on legs that started with the resp illness  Current Outpatient Prescriptions on File Prior to Visit  Medication Sig Dispense Refill  . aspirin 81 MG EC tablet Take 81 mg by mouth daily.        . calcium carbonate (TUMS - DOSED IN MG ELEMENTAL CALCIUM) 500 MG chewable tablet Chew 2 tablets by mouth 2 (two) times daily.        . Ergocalciferol (VITAMIN D2) 400 UNITS TABS Take 400 Units by mouth daily. Takes 2 daily       . lisinopril-hydrochlorothiazide (PRINZIDE,ZESTORETIC) 20-12.5 MG per tablet Take 2 tablets by mouth daily.  90 tablet  6  . Multiple Vitamins-Minerals (MULTIVITAMIN WITH MINERALS) tablet Take 1 tablet by mouth daily.          Allergies  Allergen Reactions  . Sulfonamide Derivatives     REACTION: UNSPECIFIED    Past Medical History  Diagnosis Date  . Arthritis   . Cancer   . Hypertension   . Breast cancer     in 1977  . Allergy   . History of breast cancer   . GERD (gastroesophageal reflux disease)   . Osteopenia   . Hx of colonic polyps     Past Surgical History  Procedure Date  . Colonoscopy   . Polypectomy   . Mastectomy     radial rt.side/tissue removal on left  . Tubal ligation   . Dilation and curettage of uterus     Family History  Problem Relation Age of Onset  . Osteoporosis Mother   . Osteoporosis Sister     History   Social History  . Marital Status: Married    Spouse Name: N/A    Number of  Children: 2  . 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 Smoking status: Former Smoker    Quit date: 08/02/1996  . Smokeless tobacco: Never Used  . Alcohol Use: 7.5 oz/week    15 drink(s) per week  . Drug Use: No  . Sexually Active: Not on file   Other Topics Concern  . Not on file   Social History Narrative  . No narrative on file   Review of Systems Appetite is better now No chest pain No SOB now No vomiting or diarrhea    Objective:   Physical Exam  Constitutional: She appears well-developed and well-nourished. No distress.  HENT:  Mouth/Throat: Oropharynx is clear and moist. No oropharyngeal exudate.  Neck: Normal range of motion. Neck supple.  Cardiovascular: Normal rate, regular rhythm and normal heart sounds.  Exam reveals no gallop.   No murmur heard. Pulmonary/Chest: Effort normal and breath sounds normal. No respiratory distress. She has no wheezes. She has no rales.  Abdominal: Soft. There is no tenderness.  Musculoskeletal: She exhibits edema.       1+ edema in ankles.  Slight redness in right ankle Nodule on right 2nd PIP  Lymphadenopathy:    She has no cervical adenopathy.  Skin:       Blanching red macules with slight central crust on both anterior thighs  Psychiatric: She has a normal mood and affect. Her behavior is normal. Judgment and thought content normal.          Assessment & Plan:

## 2011-08-09 NOTE — Assessment & Plan Note (Signed)
Not really morphea and they blanch Not tender so not erythema nodosum May need derm eval

## 2011-08-09 NOTE — Assessment & Plan Note (Signed)
New finding BP better and no hematuria---doubt renal etiology. Rash and single nodule confuse the issue though Nothing to suggest cardiomyopathy---even postinfectious Doubt liver problem  Likely venous insuff Will try lasix prn Support hose

## 2011-08-09 NOTE — Progress Notes (Signed)
Addended by: Alvina Chou on: 08/09/2011 04:08 PM   Modules accepted: Orders

## 2011-08-09 NOTE — Patient Instructions (Signed)
If the rash doesn't get better, I would recommend a visit with your dermatologist Please try support hose for your legs

## 2011-08-09 NOTE — Assessment & Plan Note (Signed)
BP Readings from Last 3 Encounters:  08/09/11 128/70  07/09/11 160/90  05/25/11 174/100   Better today No change in med

## 2011-08-10 LAB — CBC WITH DIFFERENTIAL
Basophils Absolute: 0 10*3/uL (ref 0.0–0.2)
HCT: 36 % (ref 34.0–46.6)
Lymphocytes Absolute: 1.5 10*3/uL (ref 0.7–4.5)
MCH: 31.6 pg (ref 26.6–33.0)
MCHC: 33.3 g/dL (ref 31.5–35.7)
MCV: 95 fL (ref 79–97)
Monocytes: 10 % (ref 4–13)
Neutrophils Absolute: 5.5 10*3/uL (ref 1.8–7.8)
RDW: 13.2 % (ref 12.3–15.4)
WBC: 7.9 10*3/uL (ref 4.0–10.5)

## 2011-08-10 LAB — COMPREHENSIVE METABOLIC PANEL
Albumin: 3.6 g/dL (ref 3.6–4.8)
Alkaline Phosphatase: 73 IU/L (ref 25–165)
BUN/Creatinine Ratio: 16 (ref 11–26)
BUN: 7 mg/dL — ABNORMAL LOW (ref 8–27)
Chloride: 96 mmol/L — ABNORMAL LOW (ref 97–108)
GFR calc Af Amer: 123 mL/min/{1.73_m2} (ref >59)
Globulin, Total: 2.3 g/dL (ref 1.5–4.5)
Total Bilirubin: 0.3 mg/dL (ref 0.0–1.2)

## 2011-08-10 LAB — TSH: TSH: 2.12 u[IU]/mL (ref 0.450–4.500)

## 2011-09-17 ENCOUNTER — Other Ambulatory Visit: Payer: Self-pay | Admitting: Internal Medicine

## 2011-10-12 ENCOUNTER — Encounter: Payer: Self-pay | Admitting: Internal Medicine

## 2011-10-12 ENCOUNTER — Ambulatory Visit (INDEPENDENT_AMBULATORY_CARE_PROVIDER_SITE_OTHER): Payer: 59 | Admitting: Internal Medicine

## 2011-10-12 VITALS — BP 136/80 | HR 67 | Temp 98.6°F | Ht 62.0 in | Wt 108.0 lb

## 2011-10-12 DIAGNOSIS — I10 Essential (primary) hypertension: Secondary | ICD-10-CM

## 2011-10-12 MED ORDER — LISINOPRIL-HYDROCHLOROTHIAZIDE 20-12.5 MG PO TABS: 2.0000 | ORAL_TABLET | Freq: Every day | ORAL | Status: DC

## 2011-10-12 NOTE — Assessment & Plan Note (Signed)
Good control BP Readings from Last 3 Encounters:  10/12/11 136/80  08/09/11 128/70  07/09/11 160/90   No changes

## 2011-10-12 NOTE — Progress Notes (Signed)
Subjective:    Patient ID: Karen Ross, female    DOB: [birth year], 66 y.o.   MRN: [id number]  HPI Rash cleared eventually Spots on legs have faded  Ankle swelling is better Only uses a few of the furosemide  Same BP meds Doesn't usually check her BP No headaches No chest pain No SOB---unless she is really rushing around  Has been busy caring for grandson for past 3 days  Current Outpatient Prescriptions on File Prior to Visit  Medication Sig Dispense Refill  . aspirin 81 MG EC tablet Take 81 mg by mouth daily.        . calcium carbonate (TUMS - DOSED IN MG ELEMENTAL CALCIUM) 500 MG chewable tablet Chew 2 tablets by mouth 2 (two) times daily.        . Ergocalciferol (VITAMIN D2) 400 UNITS TABS Take 400 Units by mouth daily. Takes 2 daily       . furosemide (LASIX) 20 MG tablet Take 20 mg by mouth daily as needed. For leg swelling      . lisinopril-hydrochlorothiazide (PRINZIDE,ZESTORETIC) 20-12.5 MG per tablet Take 2 tablets by mouth daily.  90 tablet  6  . Multiple Vitamins-Minerals (MULTIVITAMIN WITH MINERALS) tablet Take 1 tablet by mouth daily.          Allergies  Allergen Reactions  . Sulfonamide Derivatives     REACTION: UNSPECIFIED    Past Medical History  Diagnosis Date  . Arthritis   . Cancer   . Hypertension   . Breast cancer     in 1977  . Allergy   . History of breast cancer   . GERD (gastroesophageal reflux disease)   . Osteopenia   . Hx of colonic polyps     Past Surgical History  Procedure Date  . Colonoscopy   . Polypectomy   . Mastectomy     radial rt.side/tissue removal on left  . Tubal ligation   . Dilation and curettage of uterus     Family History  Problem Relation Age of Onset  . Osteoporosis Mother   . Osteoporosis Sister     History   Social History  . Marital Status: Married    Spouse Name: N/A    Number of Children: 2  . 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 Smoking status: Former Smoker    Quit date: 08/02/1996  . Smokeless tobacco: Never Used  . Alcohol Use: 7.5 oz/week    15 drink(s) per week  . Drug Use: No  . Sexually Active: Not on file   Other Topics Concern  . Not on file   Social History Narrative  . No narrative on file  'Review of Systems Some sinus congestion Sleeping fair--except for husband's snoring    Objective:   Physical Exam  Constitutional: She appears well-developed and well-nourished. No distress.  Neck: Normal range of motion. Neck supple. No thyromegaly present.  Cardiovascular: Normal rate, regular rhythm and normal heart sounds.  Exam reveals no gallop.   No murmur heard. Pulmonary/Chest: Effort normal and breath sounds normal. No respiratory distress. She has no wheezes. She has no rales.  Musculoskeletal: She exhibits no edema and no tenderness.  Lymphadenopathy:    She has no cervical adenopathy.  Psychiatric: She has a normal mood and affect. Her behavior is normal. Thought content normal.          Assessment & Plan:

## 2011-11-08 ENCOUNTER — Ambulatory Visit (INDEPENDENT_AMBULATORY_CARE_PROVIDER_SITE_OTHER): Payer: 59 | Admitting: Family Medicine

## 2011-11-08 ENCOUNTER — Telehealth: Payer: Self-pay | Admitting: Internal Medicine

## 2011-11-08 ENCOUNTER — Encounter: Payer: Self-pay | Admitting: Family Medicine

## 2011-11-08 VITALS — BP 144/80 | HR 54 | Temp 98.0°F | Wt 106.0 lb

## 2011-11-08 DIAGNOSIS — J069 Acute upper respiratory infection, unspecified: Secondary | ICD-10-CM

## 2011-11-08 MED ORDER — ALBUTEROL SULFATE HFA 108 (90 BASE) MCG/ACT IN AERS: 2.0000 | INHALATION_SPRAY | Freq: Four times a day (QID) | RESPIRATORY_TRACT | Status: DC | PRN

## 2011-11-08 MED ORDER — FUROSEMIDE 20 MG PO TABS: 20.0000 mg | ORAL_TABLET | Freq: Every day | ORAL | Status: DC | PRN

## 2011-11-08 NOTE — Telephone Encounter (Signed)
Must be seeing someone else I could see her at 4:30 if she prefers to see me

## 2011-11-08 NOTE — Telephone Encounter (Signed)
okay

## 2011-11-08 NOTE — Progress Notes (Signed)
duration of symptoms: 10-14 days.  rhinorrhea:yes Congestion: yes, worse at night ear pain: no but ears feel full sore throat: no Cough: yes, clear sputurm Myalgias: no other concerns: mult sick contacts No fevers.   Took corcidin, ibuprofen and delsym at night with some effect for cough.    Cough is getting better very slowly.  She's better today than yesterday.  Occ wheeze, normal w/o wheeze.    Ear and cough are the most troubling for her.   ROS: See HPI.  Otherwise negative.    Meds, vitals, and allergies reviewed.   GEN: nad, alert and oriented HEENT: mucous membranes moist, TM w/o erythema, nasal epithelium injected, OP with cobblestoning, poor TM movement.  Sinuses not ttp x4 NECK: supple w/o LA CV: rrr. PULM: ctab except for scant wheeze, no inc wob ABD: soft, +bs EXT: no edema

## 2011-11-08 NOTE — Telephone Encounter (Signed)
Triage Record Num: [id number] Operator: Lyn Hollingshead Patient Name: Karen Ross Call Date & Time: 11/08/2011 1:05:11PM Patient Phone: [phone] PCP: Tillman Abide Patient Gender: Female PCP Fax : [phone] Patient DOB: 18-Oct-1945 Practice Name: [facility] Reason for Call: Caller: [name]/Patient; PCP: Tillman Abide I.; CB#: [phone]; ; ; Call regarding Cough/Congestion; Onset-10/29/11 Afebrile. Pt has runny nose, cough, and sinus pain. Emergent s/s of URI protocol r/o. Pt to see provider within 24hrs. No appts in [facility], per [facility] pt can come today at 2:45pm be there 10 mins early. Pt agrees. Protocol(s) Used: Upper Respiratory Infection (URI) Recommended Outcome per Protocol: See Provider within 24 hours Reason for Outcome: Symptoms worsen after 7 days or symptoms do not improve after 14 days of home care Care Advice: ~ Use a cool mist humidifier to moisten air. Be sure to clean according to manufacturer's instructions. ~ Rest until symptoms improve. ~ Warm fluids may help, or try a mixture of honey and lemon juice in warm tea. Coughing up mucus or phlegm helps to get rid of an infection. A productive cough should not be stopped. A cough medicine with guaifenesin (Robitussin, Mucinex) can help loosen the mucus. Cough medicine with dextromethorphan (DM) should be avoided. Drinking lots of fluids can help loosen the mucus too, especially warm fluids. ~ Most adults need to drink 6-10 eight-ounce glasses (1.2-2.0 liters) of fluids per day unless previously told to limit fluid intake for other medical reasons. Limit fluids that contain caffeine, sugar or alcohol. Urine will be a very light yellow color when you drink enough fluids. ~ Analgesic/Antipyretic Advice - Acetaminophen: Consider acetaminophen as directed on label or by pharmacist/provider for pain or fever PRECAUTIONS: - Use if there is no history of liver disease, alcoholism, or intake of three or more  alcohol drinks per day - Only if approved by provider during pregnancy or when breastfeeding - During pregnancy, acetaminophen should not be taken more than 3 consecutive days without telling provider - Do not exceed recommended dose or frequency ~ Respiratory Hygiene: - Cover the nose/mouth tightly with a tissue when coughing or sneezing. - Use tissue 1 time and discard in the nearest waste receptacle. - Wash hands with soap and water or alcohol-based hand rub after coming into contact with respiratory secretions and contaminated objects/materials. - Alternatively when no tissue is available, cough into the bend of the elbow. - .Avoid touching your eyes, nose or mouth. ~ 11/08/2011 1:25:43PM Page 1 of 1 [id number]

## 2011-11-08 NOTE — Telephone Encounter (Signed)
Spoke with patient and offered the 4:30 appt today, but patient states thats a little to late, she would love to see Dr. Alphonsus Sias at 2:45, I advised we have pt's already scheduled. She will see Dr. Para March today at 2:45.

## 2011-11-08 NOTE — Patient Instructions (Signed)
Use the inhaler every 6 hours as needed for cough or wheeze.  This should gradually get better.  Take care.

## 2011-11-09 DIAGNOSIS — J069 Acute upper respiratory infection, unspecified: Secondary | ICD-10-CM | POA: Insufficient documentation

## 2011-11-09 NOTE — Assessment & Plan Note (Signed)
Likely viral with slowly resolving cough.  ctab except for scant wheeze and okay for outpatient f/u.  Would use SABA prn for cough and then f/u prn.  No need for abx at this point.  Call back prn.  Nontoxic.

## 2011-11-15 ENCOUNTER — Telehealth: Payer: Self-pay | Admitting: Internal Medicine

## 2011-11-15 MED ORDER — PREDNISONE 20 MG PO TABS: 40.0000 mg | ORAL_TABLET | Freq: Every day | ORAL | Status: AC

## 2011-11-15 NOTE — Telephone Encounter (Signed)
Discussed with patient Not much sputum and no fever Inhaler seemed to help but made her jumpy Some SOB mostly when supine in bed No history of CHF  Will try several days of prednisone as this may be asthmatic type bronchitis Will need to see if breathing any worse, or if not better by next week

## 2011-11-15 NOTE — Telephone Encounter (Signed)
Caller: Kanita/Patient; PCP: Tillman Abide I.; CB#: [phone]. Call regarding Cough/Congestion. Caller reports she was seen in the office on 4/18 by Dr Para March for congestion, coughing and stuffy ears. She was given refill for "fluid pills"-Lasix to use prn and given inhaler. She has used inhaler x1 but none since because it made her very jittery. "Felt as if my heart was going to jump out of my chest."  Sxs have resolved with exception of SOB. Caller reports she still feels Sob, even with mild activity. Worse at night-when she has to continue to adjust her pillows to sleep. She reports PCP has advised her to use Lasix prn SOB in the past, but she has not done so with this illness. Caller asking for callback from PCP re Lasix and any other suggestions. Per Upper Resp Sxs Protocol, Caller advised a note will be sent for a callback from MD.

## 2011-11-29 ENCOUNTER — Ambulatory Visit (INDEPENDENT_AMBULATORY_CARE_PROVIDER_SITE_OTHER): Payer: Medicare Other | Admitting: Internal Medicine

## 2011-11-29 ENCOUNTER — Encounter: Payer: Self-pay | Admitting: Internal Medicine

## 2011-11-29 VITALS — BP 178/80 | HR 90 | Temp 98.4°F | Wt 105.0 lb

## 2011-11-29 DIAGNOSIS — J019 Acute sinusitis, unspecified: Secondary | ICD-10-CM | POA: Diagnosis not present

## 2011-11-29 MED ORDER — AMOXICILLIN-POT CLAVULANATE 875-125 MG PO TABS: 1.0000 | ORAL_TABLET | Freq: Two times a day (BID) | ORAL | Status: AC

## 2011-11-29 NOTE — Progress Notes (Signed)
Subjective:    Patient ID: Karen Ross, female    DOB: [birth year], 66 y.o.   MRN: [id number]  HPI Never got over cold from 3 weeks ago Prednisone helped her breathing but still congested in chest Left ear still full Lots of clear rhinorrhea during the day At night it all gets tight  No fever SOB is better (still trouble from the nasal congestion) No sore throat  No Rx now for this  Current Outpatient Prescriptions on File Prior to Visit  Medication Sig Dispense Refill  . albuterol (PROVENTIL HFA;VENTOLIN HFA) 108 (90 BASE) MCG/ACT inhaler Inhale 2 puffs into the lungs every 6 (six) hours as needed for wheezing.  1 Inhaler  0  . aspirin 81 MG EC tablet Take 81 mg by mouth daily.        . calcium carbonate (TUMS - DOSED IN MG ELEMENTAL CALCIUM) 500 MG chewable tablet Chew 2 tablets by mouth 2 (two) times daily.        . furosemide (LASIX) 20 MG tablet Take 1 tablet (20 mg total) by mouth daily as needed. For leg swelling  30 tablet  2  . lisinopril-hydrochlorothiazide (PRINZIDE,ZESTORETIC) 20-12.5 MG per tablet Take 2 tablets by mouth daily.  180 tablet  3  . Multiple Vitamins-Minerals (MULTIVITAMIN WITH MINERALS) tablet Take 1 tablet by mouth daily.          Allergies  Allergen Reactions  . Sulfonamide Derivatives     REACTION: UNSPECIFIED    Past Medical History  Diagnosis Date  . Arthritis   . Cancer   . Hypertension   . Breast cancer     in 1977  . Allergy   . History of breast cancer   . GERD (gastroesophageal reflux disease)   . Osteopenia   . Hx of colonic polyps     Past Surgical History  Procedure Date  . Colonoscopy   . Polypectomy   . Mastectomy     radial rt.side/tissue removal on left  . Tubal ligation   . Dilation and curettage of uterus     Family History  Problem Relation Age of Onset  . Osteoporosis Mother   . Osteoporosis Sister     History   Social History  . Marital Status: Married    Spouse Name: N/A    Number of Children: 2    . 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 Smoking status: Former Smoker    Quit date: 08/02/1996  . Smokeless tobacco: Never Used  . Alcohol Use: 7.5 oz/week    15 drink(s) per week  . Drug Use: No  . Sexually Active: Not on file   Other Topics Concern  . Not on file   Social History Narrative  . No narrative on file   Review of Systems No rash  Appetite is okay No vomiting or diarrhea Occ dizzy feeling    Objective:   Physical Exam  Constitutional: She appears well-developed and well-nourished. No distress.  HENT:  Mouth/Throat: Oropharynx is clear and moist. No oropharyngeal exudate.       Mild frontal tenderness. Has maxillary pressure also Moderate nasal swelling and inflammation   Neck: Normal range of motion. Neck supple.  Pulmonary/Chest: Effort normal and breath sounds normal. No respiratory distress. She has no wheezes. She has no rales.  Lymphadenopathy:    She has no cervical adenopathy.  Assessment & Plan:

## 2011-11-29 NOTE — Assessment & Plan Note (Signed)
Seems to have settled in with apparent secondary bacterial sinus infection Will treat with augmentin

## 2012-01-02 DIAGNOSIS — H903 Sensorineural hearing loss, bilateral: Secondary | ICD-10-CM | POA: Diagnosis not present

## 2012-01-02 DIAGNOSIS — J342 Deviated nasal septum: Secondary | ICD-10-CM | POA: Diagnosis not present

## 2012-02-04 DIAGNOSIS — J342 Deviated nasal septum: Secondary | ICD-10-CM | POA: Diagnosis not present

## 2012-06-16 ENCOUNTER — Ambulatory Visit (INDEPENDENT_AMBULATORY_CARE_PROVIDER_SITE_OTHER): Payer: Medicare Other | Admitting: Internal Medicine

## 2012-06-16 ENCOUNTER — Encounter: Payer: Self-pay | Admitting: Internal Medicine

## 2012-06-16 VITALS — BP 150/80 | HR 76 | Temp 98.6°F | Ht 61.0 in | Wt 105.0 lb

## 2012-06-16 DIAGNOSIS — M899 Disorder of bone, unspecified: Secondary | ICD-10-CM

## 2012-06-16 DIAGNOSIS — J309 Allergic rhinitis, unspecified: Secondary | ICD-10-CM | POA: Diagnosis not present

## 2012-06-16 DIAGNOSIS — Z23 Encounter for immunization: Secondary | ICD-10-CM | POA: Diagnosis not present

## 2012-06-16 DIAGNOSIS — Z0000 Encounter for general adult medical examination without abnormal findings: Secondary | ICD-10-CM | POA: Diagnosis not present

## 2012-06-16 DIAGNOSIS — M949 Disorder of cartilage, unspecified: Secondary | ICD-10-CM

## 2012-06-16 DIAGNOSIS — I10 Essential (primary) hypertension: Secondary | ICD-10-CM

## 2012-06-16 NOTE — Assessment & Plan Note (Signed)
Considering septal repair by Dr Jenne Campus  Some symptom relief from flonase

## 2012-06-16 NOTE — Assessment & Plan Note (Signed)
On vitamin D and calcium Regular weight bearing exercise---encouraged this for daily

## 2012-06-16 NOTE — Assessment & Plan Note (Signed)
I have personally reviewed the Medicare Annual Wellness questionnaire and have noted 1. The patient's medical and social history 2. Their use of alcohol, tobacco or illicit drugs 3. Their current medications and supplements 4. The patient's functional ability including ADL's, fall risks, home safety risks and hearing or visual             impairment. 5. Diet and physical activities 6. Evidence for depression or mood disorders  The patients weight, height, BMI and visual acuity have been recorded in the chart I have made referrals, counseling and provided education to the patient based review of the above and I have provided the pt with a written personalized care plan for preventive services.  I have provided you with a copy of your personalized plan for preventive services. Please take the time to review along with your updated medication list.  Doing well imms updated No other action needed

## 2012-06-16 NOTE — Progress Notes (Signed)
Subjective:    Patient ID: Karen Ross, female    DOB: 10/28/1945, 66 y.o.   MRN: [id number]  HPI Here for Medicare Wellness and regular check up 1 fall on slick floor of garage---no real injury Only other doctor is Dr Jenne Campus for septal problems No depression or anhedonia UTD with imms with flu and tetanus today Does walk regularly No hearing and vision problems No smoker Independent with ADLs  No chest pain No SOB Uses the flonase at night for her nose No antihistamines Only occ headaches--she thinks this is sinus No dizziness or very mild. No syncope No edema  Continues on calcium and vitamin D Regular walking  No recent reflux problems Uses tums for calcium only Is careful about spicy food  Current Outpatient Prescriptions on File Prior to Visit  Medication Sig Dispense Refill  . aspirin 81 MG EC tablet Take 81 mg by mouth daily.        . calcium carbonate (TUMS - DOSED IN MG ELEMENTAL CALCIUM) 500 MG chewable tablet Chew 2 tablets by mouth 2 (two) times daily.        . Cholecalciferol (VITAMIN D3) 400 UNITS CAPS Take 2 capsules by mouth daily.      . fluticasone (FLONASE) 50 MCG/ACT nasal spray Place 2 sprays into the nose daily.      Marland Kitchen lisinopril-hydrochlorothiazide (PRINZIDE,ZESTORETIC) 20-12.5 MG per tablet Take 2 tablets by mouth daily.  180 tablet  3  . Multiple Vitamins-Minerals (MULTIVITAMIN WITH MINERALS) tablet Take 1 tablet by mouth daily.          Allergies  Allergen Reactions  . Sulfonamide Derivatives     REACTION: UNSPECIFIED    Past Medical History  Diagnosis Date  . Arthritis   . Cancer   . Hypertension   . Breast cancer     in 1977  . Allergy   . History of breast cancer   . GERD (gastroesophageal reflux disease)   . Osteopenia   . Hx of colonic polyps     Past Surgical History  Procedure Date  . Colonoscopy   . Polypectomy   . Mastectomy     radial rt.side/tissue removal on left  . Tubal ligation   . Dilation and curettage  of uterus     Family History  Problem Relation Age of Onset  . Osteoporosis Mother   . Osteoporosis Sister     History   Social History  . Marital Status: Married    Spouse Name: N/A    Number of Children: 2  . 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 Smoking status: Former Smoker    Quit date: 08/02/1996  . Smokeless tobacco: Never Used  . Alcohol Use: 7.5 oz/week    15 drink(s) per week  . Drug Use: No  . Sexually Active: Not on file   Other Topics Concern  . Not on file   Social History Narrative   No living willNo health care POA--- husband to make decisions if neededWould accept resuscitation but no prolonged artificial ventilationNo feeding tube if cognitively unaware   Review of Systems Appetite is fine Lost some weight last year when sick---gained some of it back Sleep is variable---occ bad nights with awakening (if she has someplace to go early in AM---like taking grandson to school) No sig daytime somnolence     Objective:   Physical Exam  Constitutional: She is oriented to person,  place, and time. She appears well-developed and well-nourished. No distress.  Neck: Normal range of motion. Neck supple. No thyromegaly present.  Cardiovascular: Normal rate, regular rhythm, normal heart sounds and intact distal pulses.  Exam reveals no gallop.   No murmur heard. Pulmonary/Chest: Effort normal and breath sounds normal. No respiratory distress. She has no wheezes. She has no rales.  Abdominal: Soft. There is no tenderness.  Musculoskeletal: She exhibits no edema and no tenderness.  Lymphadenopathy:    She has no cervical adenopathy.  Neurological: She is alert and oriented to person, place, and time.       President-- "Garry Heater, [place]" [phone] D-l-r-o-w Recall 2/3  Psychiatric: She has a normal mood and affect. Her behavior is normal.          Assessment & Plan:

## 2012-06-16 NOTE — Addendum Note (Signed)
Addended by: Sueanne Margarita on: 06/16/2012 04:29 PM   Modules accepted: Orders

## 2012-06-16 NOTE — Assessment & Plan Note (Signed)
BP Readings from Last 3 Encounters:  06/16/12 150/80  11/29/11 178/80  11/08/11 144/80   Repeat 144/90 Pretty much at goal for age  Will check labs

## 2012-06-17 LAB — LIPID PANEL
Cholesterol, Total: 183 mg/dL (ref 100–199)
Triglycerides: 98 mg/dL (ref 0–149)
VLDL Cholesterol Cal: 20 mg/dL (ref 5–40)

## 2012-06-17 LAB — CBC WITH DIFFERENTIAL/PLATELET
Basos: 1 % (ref 0–3)
Eosinophils Absolute: 0.1 10*3/uL (ref 0.0–0.4)
Hemoglobin: 14.3 g/dL (ref 11.1–15.9)
Lymphocytes Absolute: 1.7 10*3/uL (ref 0.7–3.1)
MCH: 33.5 pg — ABNORMAL HIGH (ref 26.6–33.0)
MCV: 97 fL (ref 79–97)
Monocytes Absolute: 0.5 10*3/uL (ref 0.1–0.9)
Neutrophils Absolute: 3.1 10*3/uL (ref 1.4–7.0)
RBC: 4.27 x10E6/uL (ref 3.77–5.28)

## 2012-06-17 LAB — BASIC METABOLIC PANEL
BUN/Creatinine Ratio: 21 (ref 11–26)
BUN: 10 mg/dL (ref 8–27)
Creatinine, Ser: 0.48 mg/dL — ABNORMAL LOW (ref 0.57–1.00)
GFR calc Af Amer: 117 mL/min/{1.73_m2} (ref >59)

## 2012-06-17 LAB — HEPATIC FUNCTION PANEL
ALT: 26 IU/L (ref 0–32)
AST: 35 IU/L (ref 0–40)
Bilirubin, Direct: 0.14 mg/dL (ref 0.00–0.40)
Total Bilirubin: 0.3 mg/dL (ref 0.0–1.2)

## 2012-06-20 ENCOUNTER — Encounter: Payer: Self-pay | Admitting: *Deleted

## 2012-10-24 ENCOUNTER — Other Ambulatory Visit: Payer: Self-pay | Admitting: Internal Medicine

## 2012-10-24 NOTE — Telephone Encounter (Signed)
Rx sent to pharmacy by escript  

## 2012-12-02 ENCOUNTER — Ambulatory Visit (INDEPENDENT_AMBULATORY_CARE_PROVIDER_SITE_OTHER): Payer: Medicare Other | Admitting: Internal Medicine

## 2012-12-02 ENCOUNTER — Encounter: Payer: Self-pay | Admitting: Internal Medicine

## 2012-12-02 VITALS — BP 148/80 | HR 84 | Temp 98.4°F | Wt 105.0 lb

## 2012-12-02 DIAGNOSIS — J069 Acute upper respiratory infection, unspecified: Secondary | ICD-10-CM

## 2012-12-02 MED ORDER — HYDROCODONE-HOMATROPINE 5-1.5 MG/5ML PO SYRP: 5.0000 mL | ORAL_SOLUTION | Freq: Every evening | ORAL | Status: DC | PRN

## 2012-12-02 MED ORDER — AMOXICILLIN 500 MG PO TABS: 1000.0000 mg | ORAL_TABLET | Freq: Two times a day (BID) | ORAL | Status: DC

## 2012-12-02 NOTE — Progress Notes (Signed)
Subjective:    Patient ID: Karen Ross, female    DOB: 1945/07/31, 67 y.o.   MRN: [id number]  HPI Has had a bad cough Runny nose and eye draining Deep chest cough-- clear sputum Head congestion and seems to have post nasal drip  Started 2-3 days ago No fever No SOB for the most part (or just from nasal congestion) Some sore throat No ear pain  Tried OTC coricidin HC--- may have helped some (may have kept her from sleeping)  Current Outpatient Prescriptions on File Prior to Visit  Medication Sig Dispense Refill  . aspirin 81 MG EC tablet Take 81 mg by mouth daily.        . calcium carbonate (TUMS - DOSED IN MG ELEMENTAL CALCIUM) 500 MG chewable tablet Chew 2 tablets by mouth 2 (two) times daily.        . Cholecalciferol (VITAMIN D3) 400 UNITS CAPS Take 2 capsules by mouth daily.      . fluticasone (FLONASE) 50 MCG/ACT nasal spray Place 2 sprays into the nose daily.      Marland Kitchen lisinopril-hydrochlorothiazide (PRINZIDE,ZESTORETIC) 20-12.5 MG per tablet TAKE TWO TABLETS BY MOUTH EVERY DAY  180 tablet  3  . Multiple Vitamins-Minerals (MULTIVITAMIN WITH MINERALS) tablet Take 1 tablet by mouth daily.         No current facility-administered medications on file prior to visit.    Allergies  Allergen Reactions  . Sulfonamide Derivatives     REACTION: UNSPECIFIED    Past Medical History  Diagnosis Date  . Arthritis   . Cancer   . Hypertension   . Breast cancer     in 1977  . Allergy   . History of breast cancer   . GERD (gastroesophageal reflux disease)   . Osteopenia   . Hx of colonic polyps     Past Surgical History  Procedure Laterality Date  . Colonoscopy    . Polypectomy    . Mastectomy      radial rt.side/tissue removal on left  . Tubal ligation    . Dilation and curettage of uterus      Family History  Problem Relation Age of Onset  . Osteoporosis Mother   . Osteoporosis Sister     History   Social History  . Marital Status: Married    Spouse Name:  N/A    Number of Children: 2  . 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 Smoking status: Former Smoker    Quit date: 08/02/1996  . Smokeless tobacco: Never Used  . Alcohol Use: 7.5 oz/week    15 drink(s) per week  . Drug Use: No  . Sexually Active: Not on file   Other Topics Concern  . Not on file   Social History Narrative   No living will   No health care POA--- husband to make decisions if needed   Would accept resuscitation but no prolonged artificial ventilation   No feeding tube if cognitively unaware   Review of Systems No rash No vomiting or diarrhea Appetite is off     Objective:   Physical Exam  Constitutional: She appears well-developed and well-nourished. No distress.  Looks washed out  HENT:  Mouth/Throat: Oropharynx is clear and moist. No oropharyngeal exudate.  No sinus tenderness TMs normal Moderate nasal inflammation  Neck: Normal range of motion. Neck supple. No thyromegaly present.  Pulmonary/Chest: Effort normal and breath sounds normal.  No respiratory distress. She has no wheezes. She has no rales.  Lymphadenopathy:    She has no cervical adenopathy.          Assessment & Plan:

## 2012-12-02 NOTE — Assessment & Plan Note (Signed)
Still seems to be viral Discussed this Symptom relief with analgesics Cough syrup Amoxil if worsens

## 2012-12-02 NOTE — Patient Instructions (Signed)
Please start the amoxicillin antibiotic if you worsen (like with nasty sputum) or if you are not improving by next week.

## 2013-06-22 ENCOUNTER — Encounter: Payer: Self-pay | Admitting: Internal Medicine

## 2013-06-22 ENCOUNTER — Ambulatory Visit (INDEPENDENT_AMBULATORY_CARE_PROVIDER_SITE_OTHER): Payer: Medicare Other | Admitting: Internal Medicine

## 2013-06-22 VITALS — BP 158/90 | HR 84 | Temp 98.0°F | Ht 61.0 in | Wt 110.0 lb

## 2013-06-22 DIAGNOSIS — I7300 Raynaud's syndrome without gangrene: Secondary | ICD-10-CM | POA: Insufficient documentation

## 2013-06-22 DIAGNOSIS — Z23 Encounter for immunization: Secondary | ICD-10-CM

## 2013-06-22 DIAGNOSIS — K219 Gastro-esophageal reflux disease without esophagitis: Secondary | ICD-10-CM | POA: Diagnosis not present

## 2013-06-22 DIAGNOSIS — I10 Essential (primary) hypertension: Secondary | ICD-10-CM

## 2013-06-22 DIAGNOSIS — Z0000 Encounter for general adult medical examination without abnormal findings: Secondary | ICD-10-CM | POA: Diagnosis not present

## 2013-06-22 DIAGNOSIS — M1990 Unspecified osteoarthritis, unspecified site: Secondary | ICD-10-CM

## 2013-06-22 NOTE — Assessment & Plan Note (Signed)
I have personally reviewed the Medicare Annual Wellness questionnaire and have noted 1. The patient's medical and social history 2. Their use of alcohol, tobacco or illicit drugs 3. Their current medications and supplements 4. The patient's functional ability including ADL's, fall risks, home safety risks and hearing or visual             impairment. 5. Diet and physical activities 6. Evidence for depression or mood disorders  The patients weight, height, BMI and visual acuity have been recorded in the chart I have made referrals, counseling and provided education to the patient based review of the above and I have provided the pt with a written personalized care plan for preventive services.  I have provided you with a copy of your personalized plan for preventive services. Please take the time to review along with your updated medication list.  Flu shot today UTD otherwise No new concerns--- some memory issues but not at MCI level at this point

## 2013-06-22 NOTE — Progress Notes (Signed)
Pre-visit discussion using our clinic review tool. No additional management support is needed unless otherwise documented below in the visit note.  

## 2013-06-22 NOTE — Patient Instructions (Signed)
Raynaud's Syndrome Raynaud's Syndrome is a disorder of the blood vessels in your hands and feet. It occurs when small arteries of the arms/hands or legs/feet become sensitive to cold or emotional upset. This causes the arteries to constrict, or narrow, and reduces blood flow to the area. The color in the fingers or toes changes from white to bluish to red and this is not usually painful. There may be numbness and tingling. Sores on the skin (ulcers) can form. Symptoms are usually relieved by warming. HOME CARE INSTRUCTIONS   Avoid exposure to cold. Keep your whole body warm and dry. Dress in layers. Wear mittens or gloves when handling ice or frozen food and when outdoors. Use holders for glasses or cans containing cold drinks. If possible, stay indoors during cold weather.  Limit your use of caffeine. Switch to decaffeinated coffee, tea, and soda pop. Avoid chocolate.  Avoid smoking or being around cigarette smoke. Smoke will make symptoms worse.  Wear loose fitting socks and comfortable, roomy shoes.  Avoid vibrating tools and machinery.  If possible, avoid stressful and emotional situations. Exercise, meditation and yoga may help you cope with stress. Biofeedback may be useful.  Ask your caregiver about medicine (calcium channel blockers) that may control Raynaud's phenomena. SEEK MEDICAL CARE IF:   Your discomfort becomes worse, despite conservative treatment.  You develop sores on your fingers and toes that do not heal. Document Released: 07/06/2000 Document Revised: 10/01/2011 Document Reviewed: 07/13/2008 [employer] Patient Information 2014 [employer].  

## 2013-06-22 NOTE — Assessment & Plan Note (Signed)
Does okay if she avoids spicy foods tums occasionally

## 2013-06-22 NOTE — Assessment & Plan Note (Signed)
BP Readings from Last 3 Encounters:  06/22/13 158/90  12/02/12 148/80  06/16/12 150/80   BP up somewhat Would consider CCB if ever symptomatic with her Raynaud's

## 2013-06-22 NOTE — Assessment & Plan Note (Signed)
Uses the ibuprofen at night

## 2013-06-22 NOTE — Progress Notes (Signed)
Subjective:    Patient ID: Karen Ross, female    DOB: 03/07/1946, 67 y.o.   MRN: [id number]  HPI Here for Medicare wellness and follow up No new concerns No falls No depression or anhedonia No tobacco 2-4 small glasses of wine most days (4 is rare) Walks every day--takes dogs out Only sees opto and ENT Independent with instrumental ADLs Occasionally has memory issues---no apraxia (like forgetting what she came into a room for)  No chest pain No SOB No change in exercise tolerance Occasional orthostatic dizziness if she pops up quickly-- goes away quickly No syncope No edema  Gets heartburn with certain foods--like spicy food Only occasionally uses tums Appetite is fine Weight up slightly  Still has some pain in hands Achy but intermittent Occasional leg pain Uses ibuprofen at night only  Current Outpatient Prescriptions on File Prior to Visit  Medication Sig Dispense Refill  . aspirin 81 MG EC tablet Take 81 mg by mouth daily.        . calcium carbonate (TUMS - DOSED IN MG ELEMENTAL CALCIUM) 500 MG chewable tablet Chew 2 tablets by mouth 2 (two) times daily.        . Cholecalciferol (VITAMIN D3) 400 UNITS CAPS Take 2 capsules by mouth daily.      Marland Kitchen lisinopril-hydrochlorothiazide (PRINZIDE,ZESTORETIC) 20-12.5 MG per tablet TAKE TWO TABLETS BY MOUTH EVERY DAY  180 tablet  3  . Multiple Vitamins-Minerals (MULTIVITAMIN WITH MINERALS) tablet Take 1 tablet by mouth daily.         No current facility-administered medications on file prior to visit.    Allergies  Allergen Reactions  . Sulfonamide Derivatives     REACTION: UNSPECIFIED    Past Medical History  Diagnosis Date  . Arthritis   . Cancer   . Hypertension   . Breast cancer     in 1977  . Allergy   . History of breast cancer   . GERD (gastroesophageal reflux disease)   . Osteopenia   . Hx of colonic polyps     Past Surgical History  Procedure Laterality Date  . Colonoscopy    . Polypectomy      . Mastectomy      radial rt.side/tissue removal on left  . Tubal ligation    . Dilation and curettage of uterus      Family History  Problem Relation Age of Onset  . Osteoporosis Mother   . Osteoporosis Sister     History   Social History  . Marital Status: Married    Spouse Name: N/A    Number of Children: 2  . 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 Smoking status: Former Smoker    Quit date: 08/02/1996  . Smokeless tobacco: Never Used  . Alcohol Use: 7.5 oz/week    15 drink(s) per week  . Drug Use: No  . Sexual Activity: Not on file   Other Topics Concern  . Not on file   Social History Narrative   No living will   No health care POA--- husband to make decisions if needed   Would accept resuscitation but no prolonged artificial ventilation   No feeding tube if cognitively unaware   Review of Systems Generally sleeps okay----occasionally legs will hurt Bowels are fine No urinary incontinence     Objective:   Physical Exam  Constitutional: She is oriented to person, place, and time. She appears  well-developed and well-nourished. No distress.  HENT:  Mouth/Throat: Oropharynx is clear and moist. No oropharyngeal exudate.  Neck: Normal range of motion. Neck supple. No thyromegaly present.  Cardiovascular: Normal rate, regular rhythm, normal heart sounds and intact distal pulses.  Exam reveals no gallop.   No murmur heard. Pulmonary/Chest: Effort normal and breath sounds normal. No respiratory distress. She has no wheezes. She has no rales.  Abdominal: Soft. There is no tenderness.  Musculoskeletal: She exhibits no edema and no tenderness.  Lymphadenopathy:    She has no cervical adenopathy.  Neurological: She is alert and oriented to person, place, and time.  President-- "Obama, Bush, [place]" [phone] D-l-r-o-w Recall 1/3  Skin:  Toes are purplish and cool (but no pain)   Psychiatric: She has a normal mood and affect. Her behavior is normal.          Assessment & Plan:

## 2013-06-22 NOTE — Assessment & Plan Note (Signed)
No pain so will not treat

## 2013-06-23 DIAGNOSIS — I10 Essential (primary) hypertension: Secondary | ICD-10-CM | POA: Diagnosis not present

## 2013-06-24 LAB — HEPATIC FUNCTION PANEL
ALT: 18 IU/L (ref 0–32)
Albumin: 4.2 g/dL (ref 3.6–4.8)
Alkaline Phosphatase: 66 IU/L (ref 39–117)
Total Bilirubin: 0.4 mg/dL (ref 0.0–1.2)

## 2013-06-24 LAB — CBC WITH DIFFERENTIAL/PLATELET
Basophils Absolute: 0 10*3/uL (ref 0.0–0.2)
Basos: 0 %
Eos: 1 %
Hemoglobin: 13.6 g/dL (ref 11.1–15.9)
Immature Grans (Abs): 0 10*3/uL (ref 0.0–0.1)
Immature Granulocytes: 0 %
Lymphs: 25 %
Monocytes Absolute: 0.5 10*3/uL (ref 0.1–0.9)
Neutrophils Relative %: 65 %
RBC: 4.17 x10E6/uL (ref 3.77–5.28)
WBC: 5.5 10*3/uL (ref 3.4–10.8)

## 2013-06-24 LAB — TSH: TSH: 1.38 u[IU]/mL (ref 0.450–4.500)

## 2013-06-24 LAB — BASIC METABOLIC PANEL
BUN: 9 mg/dL (ref 8–27)
Chloride: 94 mmol/L — ABNORMAL LOW (ref 97–108)
Creatinine, Ser: 0.5 mg/dL — ABNORMAL LOW (ref 0.57–1.00)

## 2013-07-03 ENCOUNTER — Encounter: Payer: Self-pay | Admitting: *Deleted

## 2013-10-10 ENCOUNTER — Other Ambulatory Visit: Payer: Self-pay | Admitting: Internal Medicine

## 2014-01-14 DIAGNOSIS — H2510 Age-related nuclear cataract, unspecified eye: Secondary | ICD-10-CM | POA: Diagnosis not present

## 2014-06-08 DIAGNOSIS — Z23 Encounter for immunization: Secondary | ICD-10-CM | POA: Diagnosis not present

## 2014-06-24 ENCOUNTER — Ambulatory Visit (INDEPENDENT_AMBULATORY_CARE_PROVIDER_SITE_OTHER): Payer: Medicare Other | Admitting: Internal Medicine

## 2014-06-24 ENCOUNTER — Encounter: Payer: Self-pay | Admitting: Internal Medicine

## 2014-06-24 VITALS — BP 158/80 | HR 94 | Temp 97.6°F | Ht 61.0 in | Wt 115.0 lb

## 2014-06-24 DIAGNOSIS — K219 Gastro-esophageal reflux disease without esophagitis: Secondary | ICD-10-CM | POA: Diagnosis not present

## 2014-06-24 DIAGNOSIS — M8580 Other specified disorders of bone density and structure, unspecified site: Secondary | ICD-10-CM | POA: Diagnosis not present

## 2014-06-24 DIAGNOSIS — I10 Essential (primary) hypertension: Secondary | ICD-10-CM | POA: Diagnosis not present

## 2014-06-24 DIAGNOSIS — Z7189 Other specified counseling: Secondary | ICD-10-CM

## 2014-06-24 DIAGNOSIS — Z0000 Encounter for general adult medical examination without abnormal findings: Secondary | ICD-10-CM

## 2014-06-24 DIAGNOSIS — M899 Disorder of bone, unspecified: Secondary | ICD-10-CM | POA: Diagnosis not present

## 2014-06-24 NOTE — Progress Notes (Signed)
Subjective:    Patient ID: Karen Ross, female    DOB: [birth year], 68 y.o.   MRN: [id number]  HPI  Here for Medicare wellness and follow up  Reviewed form and advanced directives Only sees eye doctor--[facility] No falls No depression or anhedonia Vision and hearing are fine Usually has 2-3 small glasses of wine nightly No tobacco now Independent with instrumental ADLs No cognitive problems  No new concerns No problems with BP med No dizziness or syncope No chest pain or SOB No change in exercise tolerance--walks just about daily No edema  Current Outpatient Prescriptions on File Prior to Visit  Medication Sig Dispense Refill  . aspirin 81 MG EC tablet Take 81 mg by mouth daily.      . calcium carbonate (TUMS - DOSED IN MG ELEMENTAL CALCIUM) 500 MG chewable tablet Chew 2 tablets by mouth 2 (two) times daily.      Marland Kitchen ibuprofen (ADVIL,MOTRIN) 200 MG tablet Take 400 mg by mouth at bedtime.    Marland Kitchen lisinopril-hydrochlorothiazide (PRINZIDE,ZESTORETIC) 20-12.5 MG per tablet TAKE TWO TABLETS BY MOUTH ONCE DAILY 180 tablet 3   No current facility-administered medications on file prior to visit.    Allergies  Allergen Reactions  . Sulfonamide Derivatives     REACTION: UNSPECIFIED    Past Medical History  Diagnosis Date  . Arthritis   . Cancer   . Hypertension   . Breast cancer     in 1977  . Allergy   . History of breast cancer   . GERD (gastroesophageal reflux disease)   . Osteopenia   . Hx of colonic polyps   . Raynaud phenomenon     Past Surgical History  Procedure Laterality Date  . Colonoscopy    . Polypectomy    . Mastectomy      radial rt.side/tissue removal on left  . Tubal ligation    . Dilation and curettage of uterus      Family History  Problem Relation Age of Onset  . Osteoporosis Mother   . Osteoporosis Sister     History   Social History  . Marital Status: Married    Spouse Name: N/A    Number of Children: 2  . 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 Smoking status: Former Smoker    Quit date: 08/02/1996  . Smokeless tobacco: Never Used  . Alcohol Use: 7.5 oz/week    15 drink(s) per week  . Drug Use: No  . Sexual Activity: Not on file   Other Topics Concern  . Not on file   Social History Narrative   No living will   No health care POA--- husband to make decisions if needed   Would accept resuscitation but no prolonged artificial ventilation   No feeding tube if cognitively unaware   Review of Systems Weight is fairly stable---shoes are heavier Appetite is good Sleeps well other than husband's snoring No problems with bowels Voids fine Sig dyspareunia--she avoids sex due to this and works it out with husband    Objective:   Physical Exam  Constitutional: She is oriented to person, place, and time. She appears well-developed and well-nourished. No distress.  Neck: Normal range of motion. Neck supple. No thyromegaly present.  Cardiovascular: Normal rate, regular rhythm, normal heart sounds and intact distal pulses.  Exam reveals no gallop.   No murmur heard. Pulmonary/Chest: Effort normal and breath  sounds normal. No respiratory distress. She has no wheezes. She has no rales.  Musculoskeletal: She exhibits no edema or tenderness.  Toes are purplish but normal pulses  Lymphadenopathy:    She has no cervical adenopathy.  Neurological: She is alert and oriented to person, place, and time.  President-- "Obama, Bush, [place]" [phone] D-l-r-o-w Recall 1/3  Skin: No rash noted.  Psychiatric: She has a normal mood and affect. Her behavior is normal.          Assessment & Plan:

## 2014-06-24 NOTE — Assessment & Plan Note (Signed)
I have personally reviewed the Medicare Annual Wellness questionnaire and have noted 1. The patient's medical and social history 2. Their use of alcohol, tobacco or illicit drugs 3. Their current medications and supplements 4. The patient's functional ability including ADL's, fall risks, home safety risks and hearing or visual             impairment. 5. Diet and physical activities 6. Evidence for depression or mood disorders  The patients weight, height, BMI and visual acuity have been recorded in the chart I have made referrals, counseling and provided education to the patient based review of the above and I have provided the pt with a written personalized care plan for preventive services.  I have provided you with a copy of your personalized plan for preventive services. Please take the time to review along with your updated medication list.  No mammograms since bilateral mastectomies UTD on colon Needs prevnar--but too soon after flu vaccine---will do later

## 2014-06-24 NOTE — Assessment & Plan Note (Signed)
See social history She plans to do will soon---will do advanced directives then

## 2014-06-24 NOTE — Patient Instructions (Signed)
Please check your blood pressure every once in a while. Let me know if it is consistently over 150/90

## 2014-06-24 NOTE — Assessment & Plan Note (Signed)
BP Readings from Last 3 Encounters:  06/24/14 158/80  06/22/13 158/90  12/02/12 148/80   Does occasionally check BP Usually okay Seems to have white coat component Will add med if over 150 at home

## 2014-06-24 NOTE — Assessment & Plan Note (Signed)
Takes tums for calcium--hasn't needed other meds

## 2014-06-24 NOTE — Assessment & Plan Note (Signed)
On calcium and vitamin D and walks regularly

## 2014-06-24 NOTE — Progress Notes (Signed)
Pre visit review using our clinic review tool, if applicable. No additional management support is needed unless otherwise documented below in the visit note. 

## 2014-06-24 NOTE — Addendum Note (Signed)
Addended by: Ellamae Sia on: 06/24/2014 04:01 PM   Modules accepted: Orders

## 2014-06-25 ENCOUNTER — Telehealth: Payer: Self-pay | Admitting: Internal Medicine

## 2014-06-25 LAB — CBC WITH DIFFERENTIAL
BASOS ABS: 0 10*3/uL (ref 0.0–0.2)
Basos: 1 %
EOS: 1 %
Eosinophils Absolute: 0.1 10*3/uL (ref 0.0–0.4)
HCT: 42.5 % (ref 34.0–46.6)
Hemoglobin: 14.7 g/dL (ref 11.1–15.9)
IMMATURE GRANULOCYTES: 0 %
Immature Grans (Abs): 0 10*3/uL (ref 0.0–0.1)
Lymphocytes Absolute: 1.8 10*3/uL (ref 0.7–3.1)
Lymphs: 28 %
MCH: 33 pg (ref 26.6–33.0)
MCHC: 34.6 g/dL (ref 31.5–35.7)
MCV: 96 fL (ref 79–97)
MONOCYTES: 8 %
Monocytes Absolute: 0.5 10*3/uL (ref 0.1–0.9)
Neutrophils Absolute: 3.8 10*3/uL (ref 1.4–7.0)
Neutrophils Relative %: 62 %
PLATELETS: 245 10*3/uL (ref 150–379)
RBC: 4.45 x10E6/uL (ref 3.77–5.28)
RDW: 13.1 % (ref 12.3–15.4)
WBC: 6.2 10*3/uL (ref 3.4–10.8)

## 2014-06-25 LAB — COMPREHENSIVE METABOLIC PANEL
ALK PHOS: 73 IU/L (ref 39–117)
ALT: 21 IU/L (ref 0–32)
AST: 32 IU/L (ref 0–40)
Albumin/Globulin Ratio: 1.9 (ref 1.1–2.5)
Albumin: 4.5 g/dL (ref 3.6–4.8)
BILIRUBIN TOTAL: 0.2 mg/dL (ref 0.0–1.2)
BUN/Creatinine Ratio: 21 (ref 11–26)
BUN: 12 mg/dL (ref 8–27)
CO2: 25 mmol/L (ref 18–29)
Calcium: 9.4 mg/dL (ref 8.7–10.3)
Chloride: 95 mmol/L — ABNORMAL LOW (ref 97–108)
Creatinine, Ser: 0.56 mg/dL — ABNORMAL LOW (ref 0.57–1.00)
GFR calc non Af Amer: 95 mL/min/{1.73_m2} (ref >59)
GFR, EST AFRICAN AMERICAN: 110 mL/min/{1.73_m2} (ref >59)
Globulin, Total: 2.4 g/dL (ref 1.5–4.5)
Glucose: 109 mg/dL — ABNORMAL HIGH (ref 65–99)
POTASSIUM: 4.1 mmol/L (ref 3.5–5.2)
Sodium: 137 mmol/L (ref 134–144)
Total Protein: 6.9 g/dL (ref 6.0–8.5)

## 2014-06-25 LAB — T4, FREE: FREE T4: 1.14 ng/dL (ref 0.82–1.77)

## 2014-06-25 NOTE — Telephone Encounter (Signed)
emmi emailed °

## 2014-06-28 ENCOUNTER — Encounter: Payer: Self-pay | Admitting: *Deleted

## 2014-10-10 ENCOUNTER — Other Ambulatory Visit: Payer: Self-pay | Admitting: Internal Medicine

## 2014-12-28 DIAGNOSIS — H6980 Other specified disorders of Eustachian tube, unspecified ear: Secondary | ICD-10-CM | POA: Diagnosis not present

## 2014-12-28 DIAGNOSIS — I10 Essential (primary) hypertension: Secondary | ICD-10-CM | POA: Diagnosis not present

## 2014-12-28 DIAGNOSIS — J309 Allergic rhinitis, unspecified: Secondary | ICD-10-CM | POA: Diagnosis not present

## 2015-01-06 ENCOUNTER — Encounter: Payer: Self-pay | Admitting: Internal Medicine

## 2015-01-06 ENCOUNTER — Ambulatory Visit (INDEPENDENT_AMBULATORY_CARE_PROVIDER_SITE_OTHER): Payer: Medicare Other | Admitting: Internal Medicine

## 2015-01-06 VITALS — BP 150/80 | HR 79 | Temp 97.4°F | Wt 112.0 lb

## 2015-01-06 DIAGNOSIS — I10 Essential (primary) hypertension: Secondary | ICD-10-CM | POA: Diagnosis not present

## 2015-01-06 MED ORDER — AMLODIPINE BESYLATE 2.5 MG PO TABS: 2.5000 mg | ORAL_TABLET | Freq: Every day | ORAL | Status: DC

## 2015-01-06 NOTE — Progress Notes (Signed)
   Subjective:    Patient ID: Karen Ross, female    DOB: 1946-04-12, 69 y.o.   MRN: [id number]  HPI Concerned about BP Has been high at home 153/70 something yesterday--this was done sitting when relaxed  Some headaches a while back --she thinks due to congestion (frontal) BP very elevated there Put on prednisone a week ago by Dr Tami Ribas Did not take decongestant  No chest pain Some SOB with the congestion--this is improved now Slight vague dizziness--no syncope Slight ankle edema in past--none recently  Current Outpatient Prescriptions on File Prior to Visit  Medication Sig Dispense Refill  . aspirin 81 MG EC tablet Take 81 mg by mouth daily.      Marland Kitchen lisinopril-hydrochlorothiazide (PRINZIDE,ZESTORETIC) 20-12.5 MG per tablet TAKE TWO TABLETS BY MOUTH ONCE DAILY 180 tablet 3   No current facility-administered medications on file prior to visit.    Allergies  Allergen Reactions  . Sulfonamide Derivatives     REACTION: UNSPECIFIED    Past Medical History  Diagnosis Date  . Arthritis   . Cancer   . Hypertension   . Breast cancer     in 1977  . Allergy   . History of breast cancer   . GERD (gastroesophageal reflux disease)   . Osteopenia   . Hx of colonic polyps   . Raynaud phenomenon     Past Surgical History  Procedure Laterality Date  . Colonoscopy    . Polypectomy    . Mastectomy      radial rt.side/tissue removal on left  . Tubal ligation    . Dilation and curettage of uterus      Family History  Problem Relation Age of Onset  . Osteoporosis Mother   . Osteoporosis Sister     History   Social History  . Marital Status: Married    Spouse Name: N/A  . Number of Children: 2  . 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 Smoking status: Former Smoker    Quit date: 08/02/1996  . Smokeless tobacco: Never Used  . Alcohol Use: 7.5 oz/week    15 drink(s) per week  . Drug Use: No  .  Sexual Activity: Not on file   Other Topics Concern  . Not on file   Social History Narrative   No living will   No health care POA--- husband to make decisions if needed   Would accept resuscitation but no prolonged artificial ventilation   No feeding tube if cognitively unaware   Review of Systems  Sleeping fine No emotional issues Appetite is okay---improved again since she was sick     Objective:   Physical Exam  Constitutional: She appears well-developed and well-nourished. No distress.  Neck: Normal range of motion. Neck supple. No thyromegaly present.  Cardiovascular: Normal rate, regular rhythm and normal heart sounds.  Exam reveals no gallop.   No murmur heard. Pulmonary/Chest: Effort normal and breath sounds normal. No respiratory distress. She has no wheezes. She has no rales.  Musculoskeletal: She exhibits no edema or tenderness.  Lymphadenopathy:    She has no cervical adenopathy.  Psychiatric: She has a normal mood and affect. Her behavior is normal.          Assessment & Plan:

## 2015-01-06 NOTE — Patient Instructions (Signed)
Please let me know if your blood pressure remains over 150/90 consistently after you have been on the new medication for at least a month.

## 2015-01-06 NOTE — Assessment & Plan Note (Signed)
BP Readings from Last 3 Encounters:  01/06/15 150/80  06/24/14 158/80  06/22/13 158/90   Repeat 152/90 on right Discussed options--she is concerned so will add low dose of amlodipine

## 2015-01-06 NOTE — Progress Notes (Signed)
Pre visit review using our clinic review tool, if applicable. No additional management support is needed unless otherwise documented below in the visit note. 

## 2015-01-20 ENCOUNTER — Telehealth: Payer: Self-pay | Admitting: *Deleted

## 2015-01-20 NOTE — Telephone Encounter (Signed)
Find out how bad it is. I hate to put her on a second med to counteract the first. If the swelling is bothersome, I would prefer to change to another med (but then I would need to have her come back in about 6 weeks to recheck BP and kidney tests)

## 2015-01-20 NOTE — Telephone Encounter (Signed)
Pt left VM at triage,  stating that she was put on a new BP med and now her feet are swelling and per Dr. Silvio Pate at the [facility] pt was told if that happened to call back to let Dr. Silvio Pate know and he would give something for fluid

## 2015-01-21 MED ORDER — METOPROLOL SUCCINATE ER 25 MG PO TB24: 25.0000 mg | ORAL_TABLET | Freq: Every day | ORAL | Status: DC

## 2015-01-21 NOTE — Telephone Encounter (Signed)
Spoke with patient and advised results, she will call back if she has any problems

## 2015-01-21 NOTE — Telephone Encounter (Signed)
Spoke with patient and she would like something else, she states the swelling is in the left foot and gets worse the more she moves around on it. Please advise

## 2015-01-21 NOTE — Telephone Encounter (Signed)
Please have her stop the amlodipine and start the new medication (metoprolol). This medication doesn't require blood monitoring so I can just see her back for her regular appt She should let me know if she has any problems though

## 2015-02-10 ENCOUNTER — Telehealth: Payer: Self-pay | Admitting: *Deleted

## 2015-02-10 NOTE — Telephone Encounter (Signed)
Spoke with patient and she has been using a wrist cuff and a arm cuff that was her sisters, pt is not having and headaches, blurred vision or any other side effects. Pt states she feels fine, I offered appt today at 4:30 and pt declined, pt did schedule appt tomorrow at 10:15am.

## 2015-02-10 NOTE — Telephone Encounter (Signed)
Okay Have her bring in that cuff so we can check how accurate it is

## 2015-02-10 NOTE — Telephone Encounter (Signed)
Pt left voicemail at Triage. Pt said Dr. Silvio Pate changed BP med about 3 weeks ago and since then it has remained elevated. Yesterday it was 165/89, but today she checked it twice and it was 218/101, and 222/104, pt doesn't know what to do and is very worried about how high her BP is, please advise

## 2015-02-11 ENCOUNTER — Encounter: Payer: Self-pay | Admitting: Internal Medicine

## 2015-02-11 ENCOUNTER — Ambulatory Visit (INDEPENDENT_AMBULATORY_CARE_PROVIDER_SITE_OTHER): Payer: Medicare Other | Admitting: Internal Medicine

## 2015-02-11 VITALS — BP 160/100 | HR 72 | Temp 98.1°F | Wt 114.0 lb

## 2015-02-11 DIAGNOSIS — I10 Essential (primary) hypertension: Secondary | ICD-10-CM | POA: Diagnosis not present

## 2015-02-11 NOTE — Progress Notes (Signed)
Pre visit review using our clinic review tool, if applicable. No additional management support is needed unless otherwise documented below in the visit note. 

## 2015-02-11 NOTE — Progress Notes (Signed)
   Subjective:    Patient ID: Karen Ross, female    DOB: [birth year], 69 y.o.   MRN: [id number]  HPI Here for follow up of BP Unfortunately she stopped the lisinopril/HCTZ Didn't tolerate the amlodipine---leg swelling  No problems with metoprolol Slight dizziness lately No syncope No chest pain Some SOB due to nasal congestion and ear "sizzling" Using fluticasone   Current Outpatient Prescriptions on File Prior to Visit  Medication Sig Dispense Refill  . aspirin 81 MG EC tablet Take 81 mg by mouth daily.      Marland Kitchen lisinopril-hydrochlorothiazide (PRINZIDE,ZESTORETIC) 20-12.5 MG per tablet TAKE TWO TABLETS BY MOUTH ONCE DAILY 180 tablet 3  . metoprolol succinate (TOPROL-XL) 25 MG 24 hr tablet Take 1 tablet (25 mg total) by mouth daily. 90 tablet 3   No current facility-administered medications on file prior to visit.    Allergies  Allergen Reactions  . Sulfonamide Derivatives     REACTION: UNSPECIFIED    Past Medical History  Diagnosis Date  . Arthritis   . Cancer   . Hypertension   . Breast cancer     in 1977  . Allergy   . History of breast cancer   . GERD (gastroesophageal reflux disease)   . Osteopenia   . Hx of colonic polyps   . Raynaud phenomenon     Past Surgical History  Procedure Laterality Date  . Colonoscopy    . Polypectomy    . Mastectomy      radial rt.side/tissue removal on left  . Tubal ligation    . Dilation and curettage of uterus      Family History  Problem Relation Age of Onset  . Osteoporosis Mother   . Osteoporosis Sister     History   Social History  . Marital Status: Married    Spouse Name: N/A  . Number of Children: 2  . 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 Smoking status: Former Smoker    Quit date: 08/02/1996  . Smokeless tobacco: Never Used  . Alcohol Use: 7.5 oz/week    15 drink(s) per week  . Drug Use: No  . Sexual Activity: Not on file    Other Topics Concern  . Not on file   Social History Narrative   No living will   No health care POA--- husband to make decisions if needed   Would accept resuscitation but no prolonged artificial ventilation   No feeding tube if cognitively unaware   Review of Systems Sleeping okay now Appetite is fine    Objective:   Physical Exam  Constitutional: She appears well-developed and well-nourished. No distress.  Neck: Normal range of motion. Neck supple. No thyromegaly present.  Cardiovascular: Normal rate, regular rhythm and normal heart sounds.  Exam reveals no gallop.   No murmur heard. Pulmonary/Chest: Effort normal and breath sounds normal. No respiratory distress. She has no wheezes. She has no rales.  Musculoskeletal: She exhibits no edema.  Lymphadenopathy:    She has no cervical adenopathy.          Assessment & Plan:

## 2015-02-11 NOTE — Assessment & Plan Note (Signed)
BP Readings from Last 3 Encounters:  02/11/15 160/100  01/06/15 150/80  06/24/14 158/80   Repeat 184/100 on right Stopped the lisinopril unfortunately Will have her restart HR ~60 so no increase in metoprolol

## 2015-02-14 ENCOUNTER — Encounter: Payer: Self-pay | Admitting: Internal Medicine

## 2015-04-04 DIAGNOSIS — H2513 Age-related nuclear cataract, bilateral: Secondary | ICD-10-CM | POA: Diagnosis not present

## 2015-04-14 ENCOUNTER — Ambulatory Visit: Payer: Medicare Other | Admitting: Internal Medicine

## 2015-04-19 ENCOUNTER — Ambulatory Visit (INDEPENDENT_AMBULATORY_CARE_PROVIDER_SITE_OTHER): Payer: Medicare Other | Admitting: Internal Medicine

## 2015-04-19 ENCOUNTER — Encounter: Payer: Self-pay | Admitting: Internal Medicine

## 2015-04-19 VITALS — BP 158/80 | HR 90 | Temp 97.6°F | Wt 115.0 lb

## 2015-04-19 DIAGNOSIS — Z23 Encounter for immunization: Secondary | ICD-10-CM | POA: Diagnosis not present

## 2015-04-19 DIAGNOSIS — I10 Essential (primary) hypertension: Secondary | ICD-10-CM | POA: Diagnosis not present

## 2015-04-19 LAB — RENAL FUNCTION PANEL
ALBUMIN: 4.2 g/dL (ref 3.5–5.2)
BUN: 12 mg/dL (ref 6–23)
CO2: 33 mEq/L — ABNORMAL HIGH (ref 19–32)
CREATININE: 0.49 mg/dL (ref 0.40–1.20)
Calcium: 10 mg/dL (ref 8.4–10.5)
Chloride: 95 mEq/L — ABNORMAL LOW (ref 96–112)
GFR: 132.73 mL/min (ref >60.00)
Glucose, Bld: 100 mg/dL — ABNORMAL HIGH (ref 70–99)
Phosphorus: 3.3 mg/dL (ref 2.3–4.6)
Potassium: 4.1 mEq/L (ref 3.5–5.1)
SODIUM: 135 meq/L (ref 135–145)

## 2015-04-19 NOTE — Addendum Note (Signed)
Addended by: Despina Hidden on: 04/19/2015 03:17 PM   Modules accepted: Orders

## 2015-04-19 NOTE — Progress Notes (Signed)
   Subjective:    Patient ID: Karen Ross, female    DOB: 1945-09-25, 69 y.o.   MRN: [id number]  HPI Here for follow up of HTN  "I am feeling lazy and getting older" Discussed doing some exercise first thing in the morning  No problems back on BP med Occasional mild dizziness No throat problems Does cough some at night and in AM--has noticed since restarting the lisinopril Has had some left sternum or rib pain recently--funny feeling No SOB but some DOE if she hurries  Current Outpatient Prescriptions on File Prior to Visit  Medication Sig Dispense Refill  . aspirin 81 MG EC tablet Take 81 mg by mouth daily.      Marland Kitchen lisinopril-hydrochlorothiazide (PRINZIDE,ZESTORETIC) 20-12.5 MG per tablet TAKE TWO TABLETS BY MOUTH ONCE DAILY 180 tablet 3  . metoprolol succinate (TOPROL-XL) 25 MG 24 hr tablet Take 1 tablet (25 mg total) by mouth daily. 90 tablet 3   No current facility-administered medications on file prior to visit.    Allergies  Allergen Reactions  . Sulfonamide Derivatives     REACTION: UNSPECIFIED    Past Medical History  Diagnosis Date  . Arthritis   . Cancer   . Hypertension   . Breast cancer     in 1977  . Allergy   . History of breast cancer   . GERD (gastroesophageal reflux disease)   . Osteopenia   . Hx of colonic polyps   . Raynaud phenomenon     Past Surgical History  Procedure Laterality Date  . Colonoscopy    . Polypectomy    . Mastectomy      radial rt.side/tissue removal on left  . Tubal ligation    . Dilation and curettage of uterus      Family History  Problem Relation Age of Onset  . Osteoporosis Mother   . Osteoporosis Sister     Social History   Social History  . Marital Status: Married    Spouse Name: N/A  . Number of Children: 2  . 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 Smoking status: Former Smoker    Quit date: 08/02/1996  . Smokeless tobacco: Never Used   . Alcohol Use: 7.5 oz/week    15 drink(s) per week  . Drug Use: No  . Sexual Activity: Not on file   Other Topics Concern  . Not on file   Social History Narrative   No living will   No health care POA--- husband to make decisions if needed   Would accept resuscitation but no prolonged artificial ventilation   No feeding tube if cognitively unaware   Review of Systems Minor skin tear on right arm Appetite is fine Sleep is variable--occasional restless night    Objective:   Physical Exam  Constitutional: She appears well-developed and well-nourished. No distress.  Neck: Normal range of motion. Neck supple. No thyromegaly present.  Cardiovascular: Normal rate, regular rhythm and normal heart sounds.  Exam reveals no gallop.   No murmur heard. Pulmonary/Chest: Effort normal and breath sounds normal. No respiratory distress. She has no wheezes. She has no rales.  Musculoskeletal: She exhibits no edema.  Lymphadenopathy:    She has no cervical adenopathy.          Assessment & Plan:

## 2015-04-19 NOTE — Progress Notes (Signed)
Pre visit review using our clinic review tool, if applicable. No additional management support is needed unless otherwise documented below in the visit note. 

## 2015-04-19 NOTE — Assessment & Plan Note (Addendum)
BP Readings from Last 3 Encounters:  04/19/15 158/80  02/11/15 160/100  01/06/15 150/80   Better but still up Repeat 146/100 on right Discussed regular exercise to help BP Usually at home ~140/low 80's Check renal

## 2015-04-20 ENCOUNTER — Encounter: Payer: Self-pay | Admitting: *Deleted

## 2015-06-06 ENCOUNTER — Encounter: Payer: Self-pay | Admitting: Gastroenterology

## 2015-07-21 ENCOUNTER — Encounter: Payer: Self-pay | Admitting: *Deleted

## 2015-08-01 ENCOUNTER — Encounter: Payer: Medicare Other | Admitting: Internal Medicine

## 2015-09-12 ENCOUNTER — Encounter: Payer: Self-pay | Admitting: Internal Medicine

## 2015-09-12 ENCOUNTER — Ambulatory Visit (INDEPENDENT_AMBULATORY_CARE_PROVIDER_SITE_OTHER): Payer: Medicare Other | Admitting: Internal Medicine

## 2015-09-12 VITALS — BP 144/82 | HR 77 | Temp 98.3°F | Ht 60.0 in | Wt 118.0 lb

## 2015-09-12 DIAGNOSIS — I7300 Raynaud's syndrome without gangrene: Secondary | ICD-10-CM | POA: Diagnosis not present

## 2015-09-12 DIAGNOSIS — Z23 Encounter for immunization: Secondary | ICD-10-CM

## 2015-09-12 DIAGNOSIS — Z0000 Encounter for general adult medical examination without abnormal findings: Secondary | ICD-10-CM | POA: Diagnosis not present

## 2015-09-12 DIAGNOSIS — Z7189 Other specified counseling: Secondary | ICD-10-CM

## 2015-09-12 DIAGNOSIS — I10 Essential (primary) hypertension: Secondary | ICD-10-CM

## 2015-09-12 NOTE — Assessment & Plan Note (Signed)
See social history Blank forms given 

## 2015-09-12 NOTE — Assessment & Plan Note (Signed)
BP Readings from Last 3 Encounters:  09/12/15 144/82  04/19/15 158/80  02/11/15 160/100   Fair control given that she forgot med today No changes Labs last time okay

## 2015-09-12 NOTE — Assessment & Plan Note (Addendum)
I have personally reviewed the Medicare Annual Wellness questionnaire and have noted 1. The patient's medical and social history 2. Their use of alcohol, tobacco or illicit drugs 3. Their current medications and supplements 4. The patient's functional ability including ADL's, fall risks, home safety risks and hearing or visual             impairment. 5. Diet and physical activities 6. Evidence for depression or mood disorders  The patients weight, height, BMI and visual acuity have been recorded in the chart I have made referrals, counseling and provided education to the patient based review of the above and I have provided the pt with a written personalized care plan for preventive services.  I have provided you with a copy of your personalized plan for preventive services. Please take the time to review along with your updated medication list.  prevnar today Colon due 4/17---adenomatous polyps No mammograms due to bilateral mastectomies

## 2015-09-12 NOTE — Progress Notes (Signed)
Subjective:    Patient ID: Karen Ross, female    DOB: 10-May-1946, 70 y.o.   MRN: [id number]  HPI Here for Medicare wellness and follow up of chronic medical conditions Reviewed form and advanced directives Reviewed other doctors Has 1-2 drinks a day--usually wine No tobacco Walks just about daily Vision and hearing are okay No falls No depression or anhedonia Independent with instrumental ADLs No apparent cognitive problems  Had upset stomach this morning Feels better now  Has been taking her BP med regularly Usually 130's/70's when she checks Only rare headaches--seems to be from sinuses No chest pain Will feel SOB---suddenly at times. Like not enough air--better with big breath Walks regularly--no change in stamina No dizziness or syncope Slight edema at times  Bilateral mastectomies No more testing  Current Outpatient Prescriptions on File Prior to Visit  Medication Sig Dispense Refill  . aspirin 81 MG EC tablet Take 81 mg by mouth daily.      Marland Kitchen lisinopril-hydrochlorothiazide (PRINZIDE,ZESTORETIC) 20-12.5 MG per tablet TAKE TWO TABLETS BY MOUTH ONCE DAILY 180 tablet 3  . metoprolol succinate (TOPROL-XL) 25 MG 24 hr tablet Take 1 tablet (25 mg total) by mouth daily. 90 tablet 3   No current facility-administered medications on file prior to visit.    Allergies  Allergen Reactions  . Sulfonamide Derivatives     REACTION: UNSPECIFIED    Past Medical History  Diagnosis Date  . Arthritis   . Cancer ([place])   . Hypertension   . Breast cancer ([place])     in 1977  . Allergy   . History of breast cancer   . GERD (gastroesophageal reflux disease)   . Osteopenia   . Hx of colonic polyps   . Raynaud phenomenon     Past Surgical History  Procedure Laterality Date  . Colonoscopy    . Polypectomy    . Mastectomy      radial rt.side/tissue removal on left  . Tubal ligation    . Dilation and curettage of uterus      Family History  Problem Relation Age of  Onset  . Osteoporosis Mother   . Osteoporosis Sister     Social History   Social History  . Marital Status: Married    Spouse Name: N/A  . Number of Children: 2  . 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 Smoking status: Former Smoker    Quit date: 08/02/1996  . Smokeless tobacco: Never Used  . Alcohol Use: 7.5 oz/week    15 drink(s) per week  . Drug Use: No  . Sexual Activity: Not on file   Other Topics Concern  . Not on file   Social History Narrative   No living will   No health care POA--- husband to make decisions if needed   Would accept resuscitation but no prolonged artificial ventilation   No feeding tube if cognitively unaware   Review of Systems Appetite is good Weight is up slightly Sleeps fairly well most of the time Wears seat belt Bowels are okay--no blood in stool No urinary problems No back pain. Occasional hip aching--nothing persistent No rash or suspicious lesions    Objective:   Physical Exam  Constitutional: She is oriented to person, place, and time. She appears well-developed and well-nourished. No distress.  Neck: Normal range of motion. Neck supple. No thyromegaly present.  Cardiovascular: Normal rate, regular rhythm, normal heart  sounds and intact distal pulses.  Exam reveals no gallop.   No murmur heard. Pulmonary/Chest: Effort normal and breath sounds normal. No respiratory distress. She has no wheezes. She has no rales.  Abdominal: Soft. She exhibits no distension. There is no tenderness.  Musculoskeletal: She exhibits no edema or tenderness.  Toes purple to a little past MTP joints  Lymphadenopathy:    She has no cervical adenopathy.  Neurological: She is alert and oriented to person, place, and time.  President-- "Trump, Obama, ?" [id number] Recall 1/3  Skin: No rash noted. No erythema.  Psychiatric: She has a normal mood and affect. Her behavior is  normal.          Assessment & Plan:

## 2015-09-12 NOTE — Addendum Note (Signed)
Addended by: Despina Hidden on: 09/12/2015 04:48 PM   Modules accepted: Orders

## 2015-09-12 NOTE — Assessment & Plan Note (Signed)
Toes purple but no color change and no pain No action CCB if ever gets painful

## 2015-09-12 NOTE — Progress Notes (Signed)
Patient received education resource, including the self-management goal and tool. Patient verbalized understanding. 

## 2015-09-20 ENCOUNTER — Encounter: Payer: Self-pay | Admitting: Gastroenterology

## 2015-09-26 ENCOUNTER — Telehealth: Payer: Self-pay

## 2015-09-26 DIAGNOSIS — Z1211 Encounter for screening for malignant neoplasm of colon: Secondary | ICD-10-CM

## 2015-09-26 NOTE — Telephone Encounter (Signed)
Pt left v/m ; pt received letter it is time for pt to have colonoscopy and pt does not want to have at [facility]; pt wants to get referral to have colonoscopy done at [facility]. Pt request cb.pt was seen 09/12/15 for annual exam.

## 2015-09-26 NOTE — Telephone Encounter (Signed)
Consult order put in

## 2015-11-11 ENCOUNTER — Other Ambulatory Visit: Payer: Self-pay | Admitting: Internal Medicine

## 2015-11-30 ENCOUNTER — Encounter: Payer: Self-pay | Admitting: *Deleted

## 2015-12-01 ENCOUNTER — Ambulatory Visit
Admission: RE | Admit: 2015-12-01 | Discharge: 2015-12-01 | Disposition: A | Discharge status: Home / Self Care | Payer: Medicare Other | Source: Ambulatory Visit | Attending: Unknown Physician Specialty | Admitting: Unknown Physician Specialty

## 2015-12-01 ENCOUNTER — Ambulatory Visit: Payer: Medicare Other | Admitting: Anesthesiology

## 2015-12-01 ENCOUNTER — Encounter: Payer: Self-pay | Admitting: *Deleted

## 2015-12-01 ENCOUNTER — Encounter
Admission: RE | Disposition: A | Discharge status: Home / Self Care | Payer: Self-pay | Source: Ambulatory Visit | Attending: Unknown Physician Specialty

## 2015-12-01 DIAGNOSIS — M8580 Other specified disorders of bone density and structure, unspecified site: Secondary | ICD-10-CM | POA: Insufficient documentation

## 2015-12-01 DIAGNOSIS — K635 Polyp of colon: Secondary | ICD-10-CM | POA: Diagnosis not present

## 2015-12-01 DIAGNOSIS — Z853 Personal history of malignant neoplasm of breast: Secondary | ICD-10-CM | POA: Diagnosis not present

## 2015-12-01 DIAGNOSIS — Z7982 Long term (current) use of aspirin: Secondary | ICD-10-CM | POA: Diagnosis not present

## 2015-12-01 DIAGNOSIS — Z87891 Personal history of nicotine dependence: Secondary | ICD-10-CM | POA: Diagnosis not present

## 2015-12-01 DIAGNOSIS — Z79899 Other long term (current) drug therapy: Secondary | ICD-10-CM | POA: Insufficient documentation

## 2015-12-01 DIAGNOSIS — K641 Second degree hemorrhoids: Secondary | ICD-10-CM | POA: Insufficient documentation

## 2015-12-01 DIAGNOSIS — M1990 Unspecified osteoarthritis, unspecified site: Secondary | ICD-10-CM | POA: Insufficient documentation

## 2015-12-01 DIAGNOSIS — K219 Gastro-esophageal reflux disease without esophagitis: Secondary | ICD-10-CM | POA: Diagnosis not present

## 2015-12-01 DIAGNOSIS — K644 Residual hemorrhoidal skin tags: Secondary | ICD-10-CM | POA: Diagnosis not present

## 2015-12-01 DIAGNOSIS — Z86010 Personal history of colonic polyps: Secondary | ICD-10-CM | POA: Diagnosis not present

## 2015-12-01 DIAGNOSIS — Z1211 Encounter for screening for malignant neoplasm of colon: Secondary | ICD-10-CM | POA: Diagnosis not present

## 2015-12-01 DIAGNOSIS — K5790 Diverticulosis of intestine, part unspecified, without perforation or abscess without bleeding: Secondary | ICD-10-CM | POA: Diagnosis not present

## 2015-12-01 DIAGNOSIS — K5730 Diverticulosis of large intestine without perforation or abscess without bleeding: Secondary | ICD-10-CM | POA: Diagnosis not present

## 2015-12-01 DIAGNOSIS — D128 Benign neoplasm of rectum: Secondary | ICD-10-CM | POA: Diagnosis not present

## 2015-12-01 DIAGNOSIS — K621 Rectal polyp: Secondary | ICD-10-CM | POA: Insufficient documentation

## 2015-12-01 DIAGNOSIS — I10 Essential (primary) hypertension: Secondary | ICD-10-CM | POA: Insufficient documentation

## 2015-12-01 DIAGNOSIS — K648 Other hemorrhoids: Secondary | ICD-10-CM | POA: Diagnosis not present

## 2015-12-01 HISTORY — PX: COLONOSCOPY WITH PROPOFOL: SHX5780

## 2015-12-01 SURGERY — COLONOSCOPY WITH PROPOFOL
Anesthesia: General

## 2015-12-01 MED ORDER — SODIUM CHLORIDE 0.9 % IV SOLN
INTRAVENOUS | Status: DC
  Administered 2015-12-01: 07:00:00 via INTRAVENOUS

## 2015-12-01 MED ORDER — MIDAZOLAM HCL 5 MG/5ML IJ SOLN
INTRAMUSCULAR | Status: DC | PRN
  Administered 2015-12-01: 1 mg via INTRAVENOUS

## 2015-12-01 MED ORDER — PROPOFOL 500 MG/50ML IV EMUL
INTRAVENOUS | Status: DC | PRN
Start: 2015-12-01 — End: 2015-12-01
  Administered 2015-12-01: 140 ug/kg/min via INTRAVENOUS

## 2015-12-01 MED ORDER — FENTANYL CITRATE (PF) 100 MCG/2ML IJ SOLN
INTRAMUSCULAR | Status: DC | PRN
  Administered 2015-12-01: 50 ug via INTRAVENOUS

## 2015-12-01 MED ORDER — PROPOFOL 10 MG/ML IV BOLUS
INTRAVENOUS | Status: DC | PRN
  Administered 2015-12-01: 80 mg via INTRAVENOUS

## 2015-12-01 MED ORDER — SODIUM CHLORIDE 0.9 % IV SOLN: INTRAVENOUS | Status: DC

## 2015-12-01 MED ORDER — LIDOCAINE HCL (CARDIAC) 20 MG/ML IV SOLN
INTRAVENOUS | Status: DC | PRN
  Administered 2015-12-01: 30 mg via INTRAVENOUS

## 2015-12-01 NOTE — Op Note (Signed)
[facility] Patient Name: Karen Ross Procedure Date: 12/01/2015 7:31 AM MRN: [id number] Account #: [id number] Date of Birth: 1945-10-22 Admit Type: Outpatient Age: 70 Room: [facility] ENDO ROOM 1 Gender: Female Note Status: Finalized Procedure:            Colonoscopy Indications:          High risk colon cancer surveillance: Personal history                        of colonic polyps Providers:            Manya Silvas, MD Referring MD:         Venia Carbon (Referring MD) Medicines:            Propofol per Anesthesia Complications:        No immediate complications. Procedure:            Pre-Anesthesia Assessment:                       - After reviewing the risks and benefits, the patient                        was deemed in satisfactory condition to undergo the                        procedure.                       After obtaining informed consent, the colonoscope was                        passed under direct vision. Throughout the procedure,                        the patient's blood pressure, pulse, and oxygen                        saturations were monitored continuously. The                        Colonoscope was introduced through the anus and                        advanced to the the cecum, identified by appendiceal                        orifice and ileocecal valve. The colonoscopy was                        performed without difficulty. The patient tolerated the                        procedure well. The quality of the bowel preparation                        was excellent. Findings:      A diminutive polyp was found in the rectum. The polyp was sessile. The       polyp was removed with a jumbo cold forceps. Resection and retrieval       were complete.      A few small-mouthed diverticula were found  in the sigmoid colon and       ascending colon.      External and internal hemorrhoids were found during endoscopy. The   hemorrhoids were medium-sized and Grade II (internal hemorrhoids that       prolapse but reduce spontaneously).      The exam was otherwise without abnormality. Impression:           - One diminutive polyp in the rectum, removed with a                        jumbo cold forceps. Resected and retrieved.                       - Diverticulosis in the sigmoid colon and in the                        ascending colon.                       - External and internal hemorrhoids.                       - The examination was otherwise normal. Recommendation:       - Await pathology results. Manya Silvas, MD 12/01/2015 7:52:43 AM This report has been signed electronically. Number of Addenda: 0 Note Initiated On: 12/01/2015 7:31 AM Scope Withdrawal Time: 0 hours 10 minutes 26 seconds  Total Procedure Duration: 0 hours 14 minutes 20 seconds       [facility]

## 2015-12-01 NOTE — Anesthesia Preprocedure Evaluation (Signed)
Anesthesia Evaluation  Patient identified by MRN, date of birth, ID band Patient awake    Reviewed: Allergy & Precautions, H&P , NPO status , Patient's Chart, lab work & pertinent test results, reviewed documented beta blocker date and time   History of Anesthesia Complications Negative for: history of anesthetic complications  Airway Mallampati: III  TM Distance: >3 FB Neck ROM: full    Dental no notable dental hx. (+) Chipped, Missing   Pulmonary neg pulmonary ROS, former smoker,    Pulmonary exam normal breath sounds clear to auscultation       Cardiovascular Exercise Tolerance: Good hypertension, (-) angina(-) CAD, (-) Past MI, (-) Cardiac Stents and (-) CABG Normal cardiovascular exam(-) dysrhythmias (-) Valvular Problems/Murmurs Rhythm:regular Rate:Normal     Neuro/Psych negative neurological ROS  negative psych ROS   GI/Hepatic Neg liver ROS, GERD  ,  Endo/Other  negative endocrine ROS  Renal/GU negative Renal ROS  negative genitourinary   Musculoskeletal   Abdominal   Peds  Hematology negative hematology ROS (+)   Anesthesia Other Findings Past Medical History:   Arthritis                                                    Hypertension                                                 Allergy                                                      History of breast cancer                                     GERD (gastroesophageal reflux disease)                       Osteopenia                                                   Hx of colonic polyps                                         Raynaud phenomenon                                           Cancer ([place])                                                 Breast cancer ([place])  Comment:in 1977  (Radical RT Mastectomy) and Tissue               Removal LT Breast   Reproductive/Obstetrics negative OB ROS                              Anesthesia Physical Anesthesia Plan  ASA: II  Anesthesia Plan: General   Post-op Pain Management:    Induction:   Airway Management Planned:   Additional Equipment:   Intra-op Plan:   Post-operative Plan:   Informed Consent: I have reviewed the patients History and Physical, chart, labs and discussed the procedure including the risks, benefits and alternatives for the proposed anesthesia with the patient or authorized representative who has indicated his/her understanding and acceptance.   Dental Advisory Given  Plan Discussed with: Anesthesiologist, CRNA and Surgeon  Anesthesia Plan Comments:         Anesthesia Quick Evaluation

## 2015-12-01 NOTE — Anesthesia Postprocedure Evaluation (Signed)
Anesthesia Post Note  Patient: Karen Ross  Procedure(s) Performed: Procedure(s) (LRB): COLONOSCOPY WITH PROPOFOL (N/A)  Patient location during evaluation: Endoscopy Anesthesia Type: General Level of consciousness: awake and alert Pain management: pain level controlled Vital Signs Assessment: post-procedure vital signs reviewed and stable Respiratory status: spontaneous breathing, nonlabored ventilation, respiratory function stable and patient connected to nasal cannula oxygen Cardiovascular status: blood pressure returned to baseline and stable Postop Assessment: no signs of nausea or vomiting Anesthetic complications: no    Last Vitals:  Filed Vitals:   12/01/15 0700 12/01/15 0754  BP: 192/99 98/47  Pulse: 84 65  Temp: 36.5 C 35.9 C  Resp: 14 14    Last Pain: There were no vitals filed for this visit.               Martha Clan

## 2015-12-01 NOTE — H&P (Signed)
Primary Care Physician:  Viviana Simpler, MD Primary Gastroenterologist:  Dr. Vira Agar  Pre-Procedure History & Physical: HPI:  Karen Ross is a 70 y.o. female is here for an colonoscopy.   Past Medical History  Diagnosis Date  . Arthritis   . Hypertension   . Allergy   . History of breast cancer   . GERD (gastroesophageal reflux disease)   . Osteopenia   . Hx of colonic polyps   . Raynaud phenomenon   . Cancer ([place])   . Breast cancer ([place])     in 1977  (Radical RT Mastectomy) and Tissue Removal LT Breast    Past Surgical History  Procedure Laterality Date  . Colonoscopy    . Polypectomy    . Mastectomy      radial rt.side/tissue removal on left  . Tubal ligation    . Dilation and curettage of uterus    . Mastectomy Bilateral     radial R side; tissue removal L side  . Breast surgery      Radical RT Masctectomy; Tissue Removal LT    Prior to Admission medications   Medication Sig Start Date End Date Taking? Authorizing Provider  aspirin 81 MG EC tablet Take 81 mg by mouth daily.     Yes Historical Provider, MD  lisinopril-hydrochlorothiazide (PRINZIDE,ZESTORETIC) 20-12.5 MG tablet TAKE TWO TABLETS BY MOUTH ONCE DAILY 11/11/15  Yes Venia Carbon, MD  metoprolol succinate (TOPROL-XL) 25 MG 24 hr tablet Take 1 tablet (25 mg total) by mouth daily. 01/21/15  Yes Venia Carbon, MD    Allergies as of 11/21/2015 - Review Complete 09/12/2015  Allergen Reaction Noted  . Sulfonamide derivatives  12/19/2006    Family History  Problem Relation Age of Onset  . Osteoporosis Mother   . Osteoporosis Sister     Social History   Social History  . Marital Status: Married    Spouse Name: N/A  . Number of Children: 2  . 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 Smoking status: Former Smoker    Quit date: 08/02/1996  . Smokeless tobacco: Never Used  . Alcohol Use: 10.2 oz/week    2 Glasses of wine, 15  Standard drinks or equivalent per week  . Drug Use: No  . Sexual Activity: Not on file   Other Topics Concern  . Not on file   Social History Narrative   No living will   No health care POA--- husband to make decisions if needed   Would accept resuscitation but no prolonged artificial ventilation   No feeding tube if cognitively unaware    Review of Systems: See HPI, otherwise negative ROS  Physical Exam: BP 192/99 mmHg  Pulse 84  Temp(Src) 97.7 F (36.5 C) (Tympanic)  Resp 14  Ht 5' (1.524 m)  Wt 53.524 kg (118 lb)  BMI 23.05 kg/m2  SpO2 97% General:   Alert,  pleasant and cooperative in NAD Head:  Normocephalic and atraumatic. Neck:  Supple; no masses or thyromegaly. Lungs:  Clear throughout to auscultation.    Heart:  Regular rate and rhythm. Abdomen:  Soft, nontender and nondistended. Normal bowel sounds, without guarding, and without rebound.   Neurologic:  Alert and  oriented x4;  grossly normal neurologically.  Impression/Plan: Karen Ross is here for an colonoscopy to be performed for [facility] colon polyps  Risks, benefits, limitations, and alternatives regarding  colonoscopy have been reviewed  with the patient.  Questions have been answered.  All parties agreeable.   Gaylyn Cheers, MD  12/01/2015, 7:27 AM

## 2015-12-01 NOTE — Transfer of Care (Signed)
Immediate Anesthesia Transfer of Care Note  Patient: Karen Ross  Procedure(s) Performed: Procedure(s): COLONOSCOPY WITH PROPOFOL (N/A)  Patient Location: PACU and Endoscopy Unit  Anesthesia Type:General  Level of Consciousness: sedated and patient cooperative  Airway & Oxygen Therapy: Patient Spontanous Breathing and Patient connected to nasal cannula oxygen  Post-op Assessment: Report given to RN and Post -op Vital signs reviewed and stable  Post vital signs: Reviewed and stable  Last Vitals:  Filed Vitals:   12/01/15 0700  BP: 192/99  Pulse: 84  Temp: 36.5 C  Resp: 14    Last Pain: There were no vitals filed for this visit.       Complications: No apparent anesthesia complications

## 2015-12-02 LAB — SURGICAL PATHOLOGY

## 2015-12-03 ENCOUNTER — Encounter: Payer: Self-pay | Admitting: Unknown Physician Specialty

## 2016-01-08 ENCOUNTER — Other Ambulatory Visit: Payer: Self-pay | Admitting: Internal Medicine

## 2016-06-18 DIAGNOSIS — Z23 Encounter for immunization: Secondary | ICD-10-CM | POA: Diagnosis not present

## 2016-08-14 DIAGNOSIS — Z23 Encounter for immunization: Secondary | ICD-10-CM | POA: Diagnosis not present

## 2016-09-13 ENCOUNTER — Ambulatory Visit: Payer: Medicare Other

## 2016-09-19 ENCOUNTER — Ambulatory Visit: Payer: Medicare Other

## 2016-10-29 ENCOUNTER — Encounter (INDEPENDENT_AMBULATORY_CARE_PROVIDER_SITE_OTHER): Payer: Self-pay

## 2016-10-29 ENCOUNTER — Ambulatory Visit (INDEPENDENT_AMBULATORY_CARE_PROVIDER_SITE_OTHER): Payer: Medicare Other | Admitting: Internal Medicine

## 2016-10-29 ENCOUNTER — Encounter: Payer: Self-pay | Admitting: Internal Medicine

## 2016-10-29 VITALS — BP 152/90 | HR 83 | Temp 98.2°F | Ht 60.0 in | Wt 119.0 lb

## 2016-10-29 DIAGNOSIS — Z0000 Encounter for general adult medical examination without abnormal findings: Secondary | ICD-10-CM | POA: Diagnosis not present

## 2016-10-29 DIAGNOSIS — Z7189 Other specified counseling: Secondary | ICD-10-CM

## 2016-10-29 DIAGNOSIS — M159 Polyosteoarthritis, unspecified: Secondary | ICD-10-CM | POA: Diagnosis not present

## 2016-10-29 DIAGNOSIS — I7300 Raynaud's syndrome without gangrene: Secondary | ICD-10-CM

## 2016-10-29 DIAGNOSIS — I10 Essential (primary) hypertension: Secondary | ICD-10-CM

## 2016-10-29 MED ORDER — AMLODIPINE BESYLATE 2.5 MG PO TABS: 2.5000 mg | ORAL_TABLET | Freq: Every day | ORAL | 3 refills | Status: DC

## 2016-10-29 NOTE — Assessment & Plan Note (Signed)
BP Readings from Last 3 Encounters:  10/29/16 (!) 152/90  12/01/15 (!) 98/47  09/12/15 (!) 144/82   Up more on right after our interview Probably some white coat component--but persistently elevated Will add low dose amlodipine

## 2016-10-29 NOTE — Assessment & Plan Note (Signed)
Not really painful but will try the CCB

## 2016-10-29 NOTE — Assessment & Plan Note (Signed)
See social history Blank forms done 

## 2016-10-29 NOTE — Progress Notes (Signed)
Subjective:    Patient ID: Karen Ross, female    DOB: Apr 04, 1946, 71 y.o.   MRN: [id number]  HPI Here for Medicare wellness visit and follow up of chronic health conditions Reviewed form and advanced directives Reviewed other doctors No tobacco 1-2 glasses of wine most days Tries to walk regularly Vision is not great---- may need new glasses Hearing is fine Had 1 fall in kitchen--did have some injury on head. This is better now (tripped) No depression or anhedonia Mild memory problems  Still has pain in chest wall at times Especially with turning ---since her mastectomies No pain when walking  No SOB No palpitations  No dizziness or syncope No edema Occasional headaches--mild  Feet chronically purple--but not really painful even in the cold Hands hurt at times --but probably joints  Current Outpatient Prescriptions on File Prior to Visit  Medication Sig Dispense Refill  . aspirin 81 MG EC tablet Take 81 mg by mouth daily.      Marland Kitchen lisinopril-hydrochlorothiazide (PRINZIDE,ZESTORETIC) 20-12.5 MG tablet TAKE TWO TABLETS BY MOUTH ONCE DAILY 180 tablet 3  . metoprolol succinate (TOPROL-XL) 25 MG 24 hr tablet TAKE ONE TABLET BY MOUTH ONCE DAILY 90 tablet 3   No current facility-administered medications on file prior to visit.     Allergies  Allergen Reactions  . Sulfonamide Derivatives     REACTION: UNSPECIFIED; pt states it "didn't agree with me"    Past Medical History:  Diagnosis Date  . Allergy   . Arthritis   . Breast cancer ([place])    in 1977  (Radical RT Mastectomy) and Tissue Removal LT Breast  . Cancer ([place])   . GERD (gastroesophageal reflux disease)   . History of breast cancer   . Hx of colonic polyps   . Hypertension   . Osteopenia   . Raynaud phenomenon     Past Surgical History:  Procedure Laterality Date  . BREAST SURGERY     Radical RT Masctectomy; Tissue Removal LT  . COLONOSCOPY    . COLONOSCOPY WITH PROPOFOL N/A 12/01/2015   Procedure:  COLONOSCOPY WITH PROPOFOL;  Surgeon: Manya Silvas, MD;  Location: [facility] ENDOSCOPY;  Service: Endoscopy;  Laterality: N/A;  . DILATION AND CURETTAGE OF UTERUS    . MASTECTOMY     radial rt.side/tissue removal on left  . MASTECTOMY Bilateral    radial R side; tissue removal L side  . POLYPECTOMY    . TUBAL LIGATION      Family History  Problem Relation Age of Onset  . Osteoporosis Mother   . Osteoporosis Sister     Social History   Social History  . Marital status: Married    Spouse name: N/A  . Number of children: 2  . Years of education: N/A   Occupational History  . Insurance office     Retired   Social History Main Topics  . Smoking status: Former Smoker    Quit date: 08/02/1996  . Smokeless tobacco: Never Used  . Alcohol use 10.2 oz/week    2 Glasses of wine, 15 Standard drinks or equivalent per week  . Drug use: No  . Sexual activity: Not on file   Other Topics Concern  . Not on file   Social History Narrative   No living will   No health care POA--- husband to make decisions if needed   Would accept resuscitation but no prolonged artificial ventilation   No feeding tube if cognitively unaware   Review of  Systems Sleeps okay Appetite is good Weight is stable Wears seat belt Teeth okay--regular with dentist (Dr Carman Ching) No heartburn or dysphagia Bowels are okay. No blood Voids okay. Some aching in hips---posteriorly. Does okay while walking No rash or suspicious lesions Very mild allergy symptoms--only when pollen really high    Objective:   Physical Exam  Constitutional: She is oriented to person, place, and time. She appears well-developed. No distress.  HENT:  Mouth/Throat: Oropharynx is clear and moist. No oropharyngeal exudate.  Neck: No thyromegaly present.  Cardiovascular: Normal rate, regular rhythm and normal heart sounds.  Exam reveals no gallop.   No murmur heard. Faint pedal pulses  Pulmonary/Chest: Effort normal and breath sounds  normal. No respiratory distress. She has no wheezes. She has no rales.  Musculoskeletal: She exhibits no edema.  Purplish discoloration of toes to MTPs of both feet  Lymphadenopathy:    She has no cervical adenopathy.  Neurological: She is alert and oriented to person, place, and time.  President-- "?, Obama, ?" [phone] D-l-r-o-w (with difficulty) Recall 1/3 Really anxious/frazzled  Skin: No rash noted.  Psychiatric: She has a normal mood and affect. Her behavior is normal.  Mildly anxious          Assessment & Plan:

## 2016-10-29 NOTE — Patient Instructions (Addendum)
Please check your blood pressure once or twice a month. Let me know if it is consistently over 140/90. Please let me know if you have any problems with the new medication (most likely thing would be swelling in hands/feet)

## 2016-10-29 NOTE — Progress Notes (Signed)
Pre visit review using our clinic review tool, if applicable. No additional management support is needed unless otherwise documented below in the visit note. 

## 2016-10-29 NOTE — Assessment & Plan Note (Signed)
Mild  No Rx in general

## 2016-10-29 NOTE — Assessment & Plan Note (Signed)
I have personally reviewed the Medicare Annual Wellness questionnaire and have noted 1. The patient's medical and social history 2. Their use of alcohol, tobacco or illicit drugs 3. Their current medications and supplements 4. The patient's functional ability including ADL's, fall risks, home safety risks and hearing or visual             impairment. 5. Diet and physical activities 6. Evidence for depression or mood disorders  The patients weight, height, BMI and visual acuity have been recorded in the chart I have made referrals, counseling and provided education to the patient based review of the above and I have provided the pt with a written personalized care plan for preventive services.  I have provided you with a copy of your personalized plan for preventive services. Please take the time to review along with your updated medication list.  UTD on vaccines --yearly flu No mammograms--- bilateral mastectomies Colon due 2022 Discussed walking, etc

## 2016-10-30 LAB — CBC WITH DIFFERENTIAL/PLATELET
Basophils Absolute: 0.1 10*3/uL (ref 0.0–0.1)
Basophils Relative: 1.1 % (ref 0.0–3.0)
EOS ABS: 0.1 10*3/uL (ref 0.0–0.7)
EOS PCT: 1.2 % (ref 0.0–5.0)
HCT: 41.3 % (ref 36.0–46.0)
HEMOGLOBIN: 14.3 g/dL (ref 12.0–15.0)
LYMPHS PCT: 26 % (ref 12.0–46.0)
Lymphs Abs: 1.5 10*3/uL (ref 0.7–4.0)
MCHC: 34.5 g/dL (ref 30.0–36.0)
MCV: 97.8 fl (ref 78.0–100.0)
MONO ABS: 0.6 10*3/uL (ref 0.1–1.0)
Monocytes Relative: 9.6 % (ref 3.0–12.0)
Neutro Abs: 3.6 10*3/uL (ref 1.4–7.7)
Neutrophils Relative %: 62.1 % (ref 43.0–77.0)
Platelets: 194 10*3/uL (ref 150.0–400.0)
RBC: 4.23 Mil/uL (ref 3.87–5.11)
RDW: 13.6 % (ref 11.5–15.5)
WBC: 5.7 10*3/uL (ref 4.0–10.5)

## 2016-10-30 LAB — COMPREHENSIVE METABOLIC PANEL
ALT: 22 U/L (ref 0–35)
AST: 29 U/L (ref 0–37)
Albumin: 4.2 g/dL (ref 3.5–5.2)
Alkaline Phosphatase: 63 U/L (ref 39–117)
BUN: 8 mg/dL (ref 6–23)
CHLORIDE: 98 meq/L (ref 96–112)
CO2: 30 mEq/L (ref 19–32)
Calcium: 9.5 mg/dL (ref 8.4–10.5)
Creatinine, Ser: 0.5 mg/dL (ref 0.40–1.20)
GFR: 129.1 mL/min (ref >60.00)
GLUCOSE: 87 mg/dL (ref 70–99)
POTASSIUM: 3.7 meq/L (ref 3.5–5.1)
Sodium: 135 mEq/L (ref 135–145)
Total Bilirubin: 0.5 mg/dL (ref 0.2–1.2)
Total Protein: 6.5 g/dL (ref 6.0–8.3)

## 2016-10-30 LAB — T4, FREE: FREE T4: 0.99 ng/dL (ref 0.60–1.60)

## 2016-11-10 ENCOUNTER — Other Ambulatory Visit: Payer: Self-pay | Admitting: Internal Medicine

## 2016-12-19 ENCOUNTER — Telehealth: Payer: Self-pay

## 2016-12-19 NOTE — Telephone Encounter (Signed)
I expect a little swelling--if it is not bad, she should continue. If it is troubling, she can stop it and we will just need to recheck her BP

## 2016-12-19 NOTE — Telephone Encounter (Signed)
Pt left v/m; pt seen 11/08/16 and started new med; pt was to cb if swelling of hands or feet; pt said for 1 week feet have been swelling and pt request cb if should stop med.

## 2016-12-19 NOTE — Telephone Encounter (Signed)
Spoke to pt. She said she was a little better today.

## 2017-05-11 DIAGNOSIS — Z23 Encounter for immunization: Secondary | ICD-10-CM | POA: Diagnosis not present

## 2017-11-05 ENCOUNTER — Other Ambulatory Visit: Payer: Self-pay | Admitting: Internal Medicine

## 2017-11-07 ENCOUNTER — Encounter: Payer: Medicare Other | Admitting: Internal Medicine

## 2017-11-20 ENCOUNTER — Ambulatory Visit (INDEPENDENT_AMBULATORY_CARE_PROVIDER_SITE_OTHER): Payer: Medicare Other | Admitting: Internal Medicine

## 2017-11-20 ENCOUNTER — Encounter: Payer: Self-pay | Admitting: Internal Medicine

## 2017-11-20 VITALS — BP 136/88 | HR 85 | Temp 97.5°F | Ht 59.75 in | Wt 119.0 lb

## 2017-11-20 DIAGNOSIS — Z23 Encounter for immunization: Secondary | ICD-10-CM | POA: Diagnosis not present

## 2017-11-20 DIAGNOSIS — Z114 Encounter for screening for human immunodeficiency virus [HIV]: Secondary | ICD-10-CM | POA: Diagnosis not present

## 2017-11-20 DIAGNOSIS — I7300 Raynaud's syndrome without gangrene: Secondary | ICD-10-CM | POA: Diagnosis not present

## 2017-11-20 DIAGNOSIS — R413 Other amnesia: Secondary | ICD-10-CM

## 2017-11-20 DIAGNOSIS — Z0000 Encounter for general adult medical examination without abnormal findings: Secondary | ICD-10-CM | POA: Diagnosis not present

## 2017-11-20 DIAGNOSIS — G3183 Dementia with Lewy bodies: Secondary | ICD-10-CM | POA: Insufficient documentation

## 2017-11-20 DIAGNOSIS — I10 Essential (primary) hypertension: Secondary | ICD-10-CM | POA: Diagnosis not present

## 2017-11-20 DIAGNOSIS — Z7189 Other specified counseling: Secondary | ICD-10-CM

## 2017-11-20 DIAGNOSIS — F0150 Vascular dementia without behavioral disturbance: Secondary | ICD-10-CM | POA: Insufficient documentation

## 2017-11-20 LAB — COMPREHENSIVE METABOLIC PANEL
ALBUMIN: 4.3 g/dL (ref 3.5–5.2)
ALT: 33 U/L (ref 0–35)
AST: 38 U/L — ABNORMAL HIGH (ref 0–37)
Alkaline Phosphatase: 73 U/L (ref 39–117)
BUN: 10 mg/dL (ref 6–23)
CALCIUM: 9.6 mg/dL (ref 8.4–10.5)
CHLORIDE: 96 meq/L (ref 96–112)
CO2: 30 meq/L (ref 19–32)
Creatinine, Ser: 0.48 mg/dL (ref 0.40–1.20)
GFR: 134.93 mL/min (ref >60.00)
Glucose, Bld: 99 mg/dL (ref 70–99)
POTASSIUM: 3.8 meq/L (ref 3.5–5.1)
Sodium: 135 mEq/L (ref 135–145)
Total Bilirubin: 0.8 mg/dL (ref 0.2–1.2)
Total Protein: 6.9 g/dL (ref 6.0–8.3)

## 2017-11-20 LAB — CBC
HEMATOCRIT: 44.9 % (ref 36.0–46.0)
HEMOGLOBIN: 15.3 g/dL — AB (ref 12.0–15.0)
MCHC: 34 g/dL (ref 30.0–36.0)
MCV: 97.9 fl (ref 78.0–100.0)
PLATELETS: 206 10*3/uL (ref 150.0–400.0)
RBC: 4.58 Mil/uL (ref 3.87–5.11)
RDW: 13 % (ref 11.5–15.5)
WBC: 6.8 10*3/uL (ref 4.0–10.5)

## 2017-11-20 LAB — TSH: TSH: 1.24 u[IU]/mL (ref 0.35–4.50)

## 2017-11-20 LAB — T4, FREE: Free T4: 1.01 ng/dL (ref 0.60–1.60)

## 2017-11-20 LAB — VITAMIN B12: Vitamin B-12: 447 pg/mL (ref 211–911)

## 2017-11-20 NOTE — Progress Notes (Signed)
Subjective:    Patient ID: Karen Ross, female    DOB: September 07, 1945, 72 y.o.   MRN: [id number]  HPI Here for Medicare wellness visit and follow up of chronic health conditions With husband Reviewed form and advanced directives Reviewed other doctors [place] once this year---did have a knot on her head (just used ice) Walks briefly many days Vision and hearing are okay No depression or anhedonia Drinks box wine most days--husband concerned about the quantity (but the box probably lasts 2 weeks). Independent with instrumental ADLs Some memory issues  Has stopped amlodipine due to swelling (soon after starting last year) Also stopped the metoprolol Occasionally checks BP---- doesn't remember numbers but okay No chest pain No SOB Walks briefly in neighborhood--discussed increasing distance (but no change in stamina) No dizziness or syncope  Some purple discoloration in feet still Intermittent No regular foot pain though  Current Outpatient Medications on File Prior to Visit  Medication Sig Dispense Refill  . aspirin 81 MG EC tablet Take 81 mg by mouth daily.      Marland Kitchen lisinopril-hydrochlorothiazide (PRINZIDE,ZESTORETIC) 20-12.5 MG tablet TAKE 2 TABLETS BY MOUTH ONCE DAILY 180 tablet 0   No current facility-administered medications on file prior to visit.     Allergies  Allergen Reactions  . Sulfonamide Derivatives     REACTION: UNSPECIFIED; pt states it "didn't agree with me"    Past Medical History:  Diagnosis Date  . Allergy   . Arthritis   . Breast cancer ([place])    in 1977  (Radical RT Mastectomy) and Tissue Removal LT Breast  . Cancer ([place])   . GERD (gastroesophageal reflux disease)   . History of breast cancer   . Hx of colonic polyps   . Hypertension   . Osteopenia   . Raynaud phenomenon     Past Surgical History:  Procedure Laterality Date  . BREAST SURGERY     Radical RT Masctectomy; Tissue Removal LT  . COLONOSCOPY    . COLONOSCOPY WITH PROPOFOL N/A  12/01/2015   Procedure: COLONOSCOPY WITH PROPOFOL;  Surgeon: Manya Silvas, MD;  Location: [facility] ENDOSCOPY;  Service: Endoscopy;  Laterality: N/A;  . DILATION AND CURETTAGE OF UTERUS    . MASTECTOMY     radial rt.side/tissue removal on left  . MASTECTOMY Bilateral    radial R side; tissue removal L side  . POLYPECTOMY    . TUBAL LIGATION      Family History  Problem Relation Age of Onset  . Osteoporosis Mother   . Osteoporosis Sister     Social History   Socioeconomic History  . Marital status: Married    Spouse name: Not on file  . Number of children: 2  . Years of education: Not on file  . Highest education Ross: Not on file  Occupational History  . 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 Financial resource strain: Not on file  . Food insecurity:    Worry: Not on file    Inability: Not on file  . Transportation needs:    Medical: Not on file    Non-medical: Not on file  Tobacco Use  . Smoking status: Former Smoker    Last attempt to quit: 08/02/1996    Years since quitting: 21.3  . Smokeless tobacco: Never Used  Substance and Sexual Activity  . Alcohol use: Yes    Alcohol/week: 10.2 oz    Types: 2 Glasses of wine, 15 Standard drinks or equivalent  per week  . Drug use: No  . Sexual activity: Not on file  Lifestyle  . Physical activity:    Days per week: Not on file    Minutes per session: Not on file  . Stress: Not on file  Relationships  . Social connections:    Talks on phone: Not on file    Gets together: Not on file    Attends religious service: Not on file    Active member of club or organization: Not on file    Attends meetings of clubs or organizations: Not on file    Relationship status: Not on file  . Intimate partner violence:    Fear of current or ex partner: Not on file    Emotionally abused: Not on file    Physically abused: Not on file    Forced sexual activity: Not on file  Other Topics Concern  . Not on file   Social History Narrative   No living will   No health care POA--- husband to make decisions if needed---alternate son Elberta Fortis   Would accept resuscitation but no prolonged artificial ventilation   No feeding tube if cognitively unaware   Review of Systems No sig arthritis or back pain Occasional headaches Wears seat belt Appetite is good Weight stable Sleeps well mostly Bowels are usually okay--some loose stools. No blood No dysuria or hematuria. No incontinence No skin rash or suspicious lesions Teeth okay---usually keeps up with dentist    Objective:   Physical Exam  Constitutional: She appears well-developed. No distress.  HENT:  Mouth/Throat: Oropharynx is clear and moist. No oropharyngeal exudate.  Neck:  Mild symmetric thyroid enlargement without sig nodule  Cardiovascular: Normal rate, regular rhythm, normal heart sounds and intact distal pulses. Exam reveals no gallop.  No murmur heard. Pulmonary/Chest: Effort normal and breath sounds normal. No stridor. No respiratory distress. She has no wheezes. She has no rales.  Abdominal: Soft. There is no tenderness.  Musculoskeletal: She exhibits no edema or tenderness.  Toes purplish as usual  Lymphadenopathy:    She has no cervical adenopathy.  Neurological: She is alert.  April/May--- didn't know year President-- ?? 100-92-? D-l-r-o-w Recall 0/3  Skin: Skin is warm. No rash noted.  Psychiatric: She has a normal mood and affect. Her behavior is normal.          Assessment & Plan:

## 2017-11-20 NOTE — Progress Notes (Signed)
Hearing Screening   Method: Audiometry   125Hz  250Hz  500Hz  1000Hz  2000Hz  3000Hz  4000Hz  6000Hz  8000Hz   Right ear:   40 40 25  40    Left ear:   40 40 25  40      Visual Acuity Screening   Right eye Left eye Both eyes  Without correction:     With correction: 20/40 20/30 20/25

## 2017-11-20 NOTE — Assessment & Plan Note (Signed)
I have personally reviewed the Medicare Annual Wellness questionnaire and have noted 1. The patient's medical and social history 2. Their use of alcohol, tobacco or illicit drugs 3. Their current medications and supplements 4. The patient's functional ability including ADL's, fall risks, home safety risks and hearing or visual             impairment. 5. Diet and physical activities 6. Evidence for depression or mood disorders  The patients weight, height, BMI and visual acuity have been recorded in the chart I have made referrals, counseling and provided education to the patient based review of the above and I have provided the pt with a written personalized care plan for preventive services.  I have provided you with a copy of your personalized plan for preventive services. Please take the time to review along with your updated medication list.  No mammograms--mastectomies Colon due 2022 No pap due to age Yearly flu vaccine Consider shingrix Discussed increasing exercise

## 2017-11-20 NOTE — Assessment & Plan Note (Signed)
No pain Didn't tolerate amlodipine

## 2017-11-20 NOTE — Assessment & Plan Note (Signed)
BP Readings from Last 3 Encounters:  11/20/17 136/88  10/29/16 (!) 152/90  12/01/15 (!) 98/47   Good control No changes needed

## 2017-11-20 NOTE — Assessment & Plan Note (Signed)
See social history 

## 2017-11-20 NOTE — Assessment & Plan Note (Signed)
Concerning change from last year Will check labs and MRI Recheck soon for follow up and [facility] Consider neurologist Limit alcohol

## 2017-11-20 NOTE — Addendum Note (Signed)
Addended by: Pilar Grammes on: 11/20/2017 04:25 PM   Modules accepted: Orders

## 2017-11-20 NOTE — Patient Instructions (Addendum)
Please try a multivitamin daily. Please cut back the aspirin to no more that every other day.

## 2017-11-21 LAB — HIV ANTIBODY (ROUTINE TESTING W REFLEX): HIV: NONREACTIVE

## 2017-11-21 LAB — RPR: RPR Ser Ql: NONREACTIVE

## 2017-11-26 ENCOUNTER — Ambulatory Visit
Admission: RE | Admit: 2017-11-26 | Discharge: 2017-11-26 | Disposition: A | Discharge status: Home / Self Care | Payer: Medicare Other | Source: Ambulatory Visit | Attending: Internal Medicine | Admitting: Internal Medicine

## 2017-11-26 DIAGNOSIS — R413 Other amnesia: Secondary | ICD-10-CM | POA: Diagnosis not present

## 2017-11-26 DIAGNOSIS — G319 Degenerative disease of nervous system, unspecified: Secondary | ICD-10-CM | POA: Diagnosis not present

## 2017-11-26 DIAGNOSIS — I6782 Cerebral ischemia: Secondary | ICD-10-CM | POA: Insufficient documentation

## 2017-11-26 DIAGNOSIS — R410 Disorientation, unspecified: Secondary | ICD-10-CM | POA: Diagnosis not present

## 2017-12-25 ENCOUNTER — Ambulatory Visit (INDEPENDENT_AMBULATORY_CARE_PROVIDER_SITE_OTHER): Payer: Medicare Other | Admitting: Internal Medicine

## 2017-12-25 ENCOUNTER — Encounter: Payer: Self-pay | Admitting: Internal Medicine

## 2017-12-25 VITALS — BP 130/88 | HR 78 | Temp 97.9°F | Ht 60.0 in | Wt 121.0 lb

## 2017-12-25 DIAGNOSIS — G3184 Mild cognitive impairment, so stated: Secondary | ICD-10-CM | POA: Diagnosis not present

## 2017-12-25 NOTE — Assessment & Plan Note (Signed)
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 Continue BP control and ASA Counseled about limiting wine to 1-2 glasses of wine (5 ounces)

## 2017-12-25 NOTE — Progress Notes (Signed)
Subjective:    Patient ID: Karen Ross, female    DOB: [birth year], 72 y.o.   MRN: [id number]  HPI Here with husband to review the memory loss  No significant change in memory since last visit Continues to be independent with ADLs Husband does most of the shopping and helps with instrumental ADLs She handles kitchen duties She also does housework  Current Outpatient Medications on File Prior to Visit  Medication Sig Dispense Refill  . aspirin 81 MG EC tablet Take 81 mg by mouth daily.      Marland Kitchen lisinopril-hydrochlorothiazide (PRINZIDE,ZESTORETIC) 20-12.5 MG tablet TAKE 2 TABLETS BY MOUTH ONCE DAILY 180 tablet 0   No current facility-administered medications on file prior to visit.     Allergies  Allergen Reactions  . Sulfonamide Derivatives     REACTION: UNSPECIFIED; pt states it "didn't agree with me"    Past Medical History:  Diagnosis Date  . Allergy   . Arthritis   . Breast cancer ([place])    in 1977  (Radical RT Mastectomy) and Tissue Removal LT Breast  . Cancer ([place])   . GERD (gastroesophageal reflux disease)   . History of breast cancer   . Hx of colonic polyps   . Hypertension   . Osteopenia   . Raynaud phenomenon     Past Surgical History:  Procedure Laterality Date  . BREAST SURGERY     Radical RT Masctectomy; Tissue Removal LT  . COLONOSCOPY    . COLONOSCOPY WITH PROPOFOL N/A 12/01/2015   Procedure: COLONOSCOPY WITH PROPOFOL;  Surgeon: Manya Silvas, MD;  Location: [facility] ENDOSCOPY;  Service: Endoscopy;  Laterality: N/A;  . DILATION AND CURETTAGE OF UTERUS    . MASTECTOMY     radial rt.side/tissue removal on left  . MASTECTOMY Bilateral    radial R side; tissue removal L side  . POLYPECTOMY    . TUBAL LIGATION      Family History  Problem Relation Age of Onset  . Osteoporosis Mother   . Osteoporosis Sister     Social History   Socioeconomic History  . Marital status: Married    Spouse name: Not on file  . Number of children: 2  . Years  of education: Not on file  . Highest education Ross: Not on file  Occupational History  . 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 Financial resource strain: Not on file  . Food insecurity:    Worry: Not on file    Inability: Not on file  . Transportation needs:    Medical: Not on file    Non-medical: Not on file  Tobacco Use  . Smoking status: Former Smoker    Last attempt to quit: 08/02/1996    Years since quitting: 21.4  . Smokeless tobacco: Never Used  Substance and Sexual Activity  . Alcohol use: Yes    Alcohol/week: 10.2 oz    Types: 2 Glasses of wine, 15 Standard drinks or equivalent per week  . Drug use: No  . Sexual activity: Not on file  Lifestyle  . Physical activity:    Days per week: Not on file    Minutes per session: Not on file  . Stress: Not on file  Relationships  . Social connections:    Talks on phone: Not on file    Gets together: Not on file    Attends religious service: Not on file    Active member of club or organization: Not  on file    Attends meetings of clubs or organizations: Not on file    Relationship status: Not on file  . Intimate partner violence:    Fear of current or ex partner: Not on file    Emotionally abused: Not on file    Physically abused: Not on file    Forced sexual activity: Not on file  Other Topics Concern  . Not on file  Social History Narrative   No living will   No health care POA--- husband to make decisions if needed---alternate son Elberta Fortis   Would accept resuscitation but no prolonged artificial ventilation   No feeding tube if cognitively unaware   Review of Systems  Sleeps okay Appetite is fine No depression     Objective:   Physical Exam  Constitutional: She appears well-developed. No distress.  Psychiatric: She has a normal mood and affect. Her behavior is normal.           Assessment & Plan:

## 2018-01-28 DIAGNOSIS — G3184 Mild cognitive impairment, so stated: Secondary | ICD-10-CM | POA: Diagnosis not present

## 2018-02-03 ENCOUNTER — Other Ambulatory Visit: Payer: Self-pay | Admitting: Internal Medicine

## 2018-04-02 ENCOUNTER — Encounter: Payer: Self-pay | Admitting: Internal Medicine

## 2018-04-02 ENCOUNTER — Ambulatory Visit (INDEPENDENT_AMBULATORY_CARE_PROVIDER_SITE_OTHER): Payer: Medicare Other | Admitting: Internal Medicine

## 2018-04-02 VITALS — BP 138/80 | HR 86 | Temp 97.6°F | Ht 60.0 in | Wt 118.0 lb

## 2018-04-02 DIAGNOSIS — F0150 Vascular dementia without behavioral disturbance: Secondary | ICD-10-CM

## 2018-04-02 DIAGNOSIS — Z23 Encounter for immunization: Secondary | ICD-10-CM | POA: Diagnosis not present

## 2018-04-02 MED ORDER — ATORVASTATIN CALCIUM 10 MG PO TABS: 10.0000 mg | ORAL_TABLET | Freq: Every day | ORAL | 3 refills | Status: DC

## 2018-04-02 NOTE — Assessment & Plan Note (Signed)
Clearly has pathologic memory issues with abnormal MRI Again reiterated limiting alcohol Not clear donepezil will help--but worth trying Asked her to restart every other day aspirin Discussed---will start low dose statin

## 2018-04-02 NOTE — Progress Notes (Signed)
Subjective:    Patient ID: Karen Ross, female    DOB: 01-24-1946, 72 y.o.   MRN: [id number]  HPI Here for follow up of cognitive problems With husband  Did see neurology NP [facility] 19/30 Prescribed donepezil---- and wants follow up She has tolerated this No apparent change since starting  Variable from day to day Seems worse at times--but overall fairly stable Now only drinking beer--4-6 some days Not as good after drinking Does forget that she is married to husband at times Other troubling issues--but not all the time (like thinking their house is her mother's) Now reluctant to shower  Had been on asa--but not taking regularly  Current Outpatient Medications on File Prior to Visit  Medication Sig Dispense Refill  . donepezil (ARICEPT) 5 MG tablet Take 1 tablet by mouth at bedtime.    Marland Kitchen lisinopril-hydrochlorothiazide (PRINZIDE,ZESTORETIC) 20-12.5 MG tablet TAKE 2 TABLETS BY MOUTH ONCE DAILY 180 tablet 3   No current facility-administered medications on file prior to visit.     Allergies  Allergen Reactions  . Sulfonamide Derivatives     REACTION: UNSPECIFIED; pt states it "didn't agree with me"    Past Medical History:  Diagnosis Date  . Allergy   . Arthritis   . Breast cancer ([place])    in 1977  (Radical RT Mastectomy) and Tissue Removal LT Breast  . Cancer ([place])   . GERD (gastroesophageal reflux disease)   . History of breast cancer   . Hx of colonic polyps   . Hypertension   . Osteopenia   . Raynaud phenomenon     Past Surgical History:  Procedure Laterality Date  . BREAST SURGERY     Radical RT Masctectomy; Tissue Removal LT  . COLONOSCOPY    . COLONOSCOPY WITH PROPOFOL N/A 12/01/2015   Procedure: COLONOSCOPY WITH PROPOFOL;  Surgeon: Manya Silvas, MD;  Location: [facility] ENDOSCOPY;  Service: Endoscopy;  Laterality: N/A;  . DILATION AND CURETTAGE OF UTERUS    . MASTECTOMY     radial rt.side/tissue removal on left  . MASTECTOMY Bilateral    radial R  side; tissue removal L side  . POLYPECTOMY    . TUBAL LIGATION      Family History  Problem Relation Age of Onset  . Osteoporosis Mother   . Osteoporosis Sister     Social History   Socioeconomic History  . Marital status: Married    Spouse name: Not on file  . Number of children: 2  . Years of education: Not on file  . Highest education Ross: Not on file  Occupational History  . 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 Financial resource strain: Not on file  . Food insecurity:    Worry: Not on file    Inability: Not on file  . Transportation needs:    Medical: Not on file    Non-medical: Not on file  Tobacco Use  . Smoking status: Former Smoker    Last attempt to quit: 08/02/1996    Years since quitting: 21.6  . Smokeless tobacco: Never Used  Substance and Sexual Activity  . Alcohol use: Yes    Alcohol/week: 17.0 standard drinks    Types: 2 Glasses of wine, 15 Standard drinks or equivalent per week  . Drug use: No  . Sexual activity: Not on file  Lifestyle  . Physical activity:    Days per week: Not on file    Minutes per session: Not on  file  . Stress: Not on file  Relationships  . Social connections:    Talks on phone: Not on file    Gets together: Not on file    Attends religious service: Not on file    Active member of club or organization: Not on file    Attends meetings of clubs or organizations: Not on file    Relationship status: Not on file  . Intimate partner violence:    Fear of current or ex partner: Not on file    Emotionally abused: Not on file    Physically abused: Not on file    Forced sexual activity: Not on file  Other Topics Concern  . Not on file  Social History Narrative   No living will   No health care POA--- husband to make decisions if needed---alternate son Elberta Fortis   Would accept resuscitation but no prolonged artificial ventilation   No feeding tube if cognitively unaware   Review of Systems Not  much exercise---small amounts of walking Sleeps okay Appetite is fine    Objective:   Physical Exam  Constitutional: She appears well-developed. No distress.  Psychiatric:  Normal interaction--mostly speaks when I ask questions Not depressed here--but husband concerned about her mood           Assessment & Plan:

## 2018-04-28 DIAGNOSIS — F0390 Unspecified dementia without behavioral disturbance: Secondary | ICD-10-CM | POA: Diagnosis not present

## 2018-12-01 ENCOUNTER — Encounter: Payer: Medicare Other | Admitting: Internal Medicine

## 2019-02-02 ENCOUNTER — Other Ambulatory Visit: Payer: Self-pay | Admitting: Internal Medicine

## 2019-03-10 DIAGNOSIS — G3184 Mild cognitive impairment, so stated: Secondary | ICD-10-CM | POA: Diagnosis not present

## 2019-03-10 DIAGNOSIS — F0390 Unspecified dementia without behavioral disturbance: Secondary | ICD-10-CM | POA: Diagnosis not present

## 2019-04-06 ENCOUNTER — Other Ambulatory Visit: Payer: Self-pay | Admitting: Internal Medicine

## 2019-04-08 ENCOUNTER — Other Ambulatory Visit: Payer: Self-pay | Admitting: Internal Medicine

## 2020-02-03 ENCOUNTER — Other Ambulatory Visit: Payer: Self-pay | Admitting: Internal Medicine

## 2020-04-06 ENCOUNTER — Encounter: Payer: Self-pay | Admitting: Internal Medicine

## 2020-04-06 ENCOUNTER — Other Ambulatory Visit: Payer: Self-pay

## 2020-04-06 ENCOUNTER — Ambulatory Visit (INDEPENDENT_AMBULATORY_CARE_PROVIDER_SITE_OTHER): Payer: Medicare Other | Admitting: Internal Medicine

## 2020-04-06 VITALS — BP 138/88 | HR 72 | Temp 97.7°F

## 2020-04-06 DIAGNOSIS — G3183 Dementia with Lewy bodies: Secondary | ICD-10-CM

## 2020-04-06 DIAGNOSIS — Z111 Encounter for screening for respiratory tuberculosis: Secondary | ICD-10-CM | POA: Diagnosis not present

## 2020-04-06 DIAGNOSIS — I10 Essential (primary) hypertension: Secondary | ICD-10-CM | POA: Diagnosis not present

## 2020-04-06 DIAGNOSIS — F0281 Dementia in other diseases classified elsewhere with behavioral disturbance: Secondary | ICD-10-CM

## 2020-04-06 DIAGNOSIS — M159 Polyosteoarthritis, unspecified: Secondary | ICD-10-CM

## 2020-04-06 DIAGNOSIS — F02818 Dementia in other diseases classified elsewhere, unspecified severity, with other behavioral disturbance: Secondary | ICD-10-CM

## 2020-04-06 NOTE — Assessment & Plan Note (Signed)
No obvious pain now Would just use tylenol prn for pain (from the standing orders)

## 2020-04-06 NOTE — Assessment & Plan Note (Signed)
Now with clear Parkinsonian features Some visual hallucinations Agitation is better---wasn't able to take the depakote and antipsychotic Will be moving to [facility] memory care

## 2020-04-06 NOTE — Progress Notes (Signed)
Subjective:    Patient ID: Karen Ross, female    DOB: May 23, 1946, 74 y.o.   MRN: [id number]  HPI Here with husband and former DIL to do forms for assisted living This visit occurred during the SARS-CoV-2 public health emergency.  Safety protocols were in place, including screening questions prior to the visit, additional usage of staff PPE, and extensive cleaning of exam room while observing appropriate contact time as indicated for disinfecting solutions.   Has been accepted into Memory Care at [facility], shuffling Occasional visual hallucinations Needs help with dressing and bathing (reluctant to allow this--often needs just sponge bath) Uses bathroom independently at times---but some urine incontinence Occasional fecal incontinence--usually makes it (trouble after stool softener) Off donepezil for some time No longer on depakote or antipsychotic---no regular agitation (needs to be redirected and repeated tries to do things ---like get in the car--if she resists)  Still taking the BP med Doesn't let us check BP  Current Outpatient Medications on File Prior to Visit  Medication Sig Dispense Refill  . lisinopril-hydrochlorothiazide (ZESTORETIC) 20-12.5 MG tablet Take 2 tablets by mouth once daily 180 tablet 0   No current facility-administered medications on file prior to visit.    Allergies  Allergen Reactions  . Sulfonamide Derivatives     REACTION: UNSPECIFIED; pt states it "didn't agree with me"    Past Medical History:  Diagnosis Date  . Allergy   . Arthritis   . Breast cancer ([place])    in 1977  (Radical RT Mastectomy) and Tissue Removal LT Breast  . Cancer ([place])   . GERD (gastroesophageal reflux disease)   . History of breast cancer   . Hx of colonic polyps   . Hypertension   . Osteopenia   . Raynaud phenomenon     Past Surgical History:  Procedure Laterality Date  . BREAST SURGERY     Radical RT Masctectomy; Tissue Removal LT    . COLONOSCOPY    . COLONOSCOPY WITH PROPOFOL N/A 12/01/2015   Procedure: COLONOSCOPY WITH PROPOFOL;  Surgeon: Manya Silvas, MD;  Location: [facility] ENDOSCOPY;  Service: Endoscopy;  Laterality: N/A;  . DILATION AND CURETTAGE OF UTERUS    . MASTECTOMY     radial rt.side/tissue removal on left  . MASTECTOMY Bilateral    radial R side; tissue removal L side  . POLYPECTOMY    . TUBAL LIGATION      Family History  Problem Relation Age of Onset  . Osteoporosis Mother   . Osteoporosis Sister     Social History   Socioeconomic History  . Marital status: Married    Spouse name: Not on file  . Number of children: 2  . Years of education: Not on file  . Highest education Ross: Not on file  Occupational History  . Occupation: Lexicographer    Comment: Retired  Tobacco Use  . Smoking status: Former Smoker    Quit date: 08/02/1996    Years since quitting: 23.6  . Smokeless tobacco: Never Used  Substance and Sexual Activity  . Alcohol use: Yes    Alcohol/week: 17.0 standard drinks    Types: 2 Glasses of wine, 15 Standard drinks or equivalent per week  . Drug use: No  . Sexual activity: Not on file  Other Topics Concern  . Not on file  Social History Narrative   No living will   No health care POA--- husband to make decisions if needed---alternate son Elberta Fortis   Would  accept resuscitation but no prolonged artificial ventilation   No feeding tube if cognitively unaware   Social Determinants of Health   Financial Resource Strain:   . Difficulty of Paying Living Expenses: Not on file  Food Insecurity:   . Worried About Charity fundraiser in the Last Year: Not on file  . Ran Out of Food in the Last Year: Not on file  Transportation Needs:   . Lack of Transportation (Medical): Not on file  . Lack of Transportation (Non-Medical): Not on file  Physical Activity:   . Days of Exercise per Week: Not on file  . Minutes of Exercise per Session: Not on file  Stress:   . Feeling of  Stress : Not on file  Social Connections:   . Frequency of Communication with Friends and Family: Not on file  . Frequency of Social Gatherings with Friends and Family: Not on file  . Attends Religious Services: Not on file  . Active Member of Clubs or Organizations: Not on file  . Attends Archivist Meetings: Not on file  . Marital Status: Not on file  Intimate Partner Violence:   . Fear of Current or Ex-Partner: Not on file  . Emotionally Abused: Not on file  . Physically Abused: Not on file  . Sexually Abused: Not on file   Review of Systems Not eating great--but not bad Has lost weight --gradually Sleep is variable. Sometimes will sleep through the night---other times is restless No SOB No sig arthritis pain    Objective:   Physical Exam Constitutional:      Comments: Clear wasting  Cardiovascular:     Rate and Rhythm: Normal rate and regular rhythm.     Heart sounds: No murmur heard.  No gallop.   Pulmonary:     Effort: Pulmonary effort is normal.     Breath sounds: Normal breath sounds. No wheezing or rales.  Skin:    Findings: No rash.  Neurological:     Mental Status: She is alert.     Comments: Stiff and shuffling gait  Psychiatric:     Comments: Resists exam Distant during visit            Assessment & Plan:

## 2020-04-06 NOTE — Assessment & Plan Note (Signed)
BP Readings from Last 3 Encounters:  04/06/20 138/88  04/02/18 138/80  12/25/17 130/88   Good control on the lisinopril/HCTZ Will hold off on labs---she would not allow Palliative approach

## 2020-04-06 NOTE — Addendum Note (Signed)
Addended by: Pilar Grammes on: 04/06/2020 01:17 PM   Modules accepted: Orders

## 2020-04-08 LAB — TB SKIN TEST
Induration: 0 mm
TB Skin Test: NEGATIVE

## 2020-06-05 ENCOUNTER — Other Ambulatory Visit: Payer: Self-pay

## 2020-06-05 ENCOUNTER — Emergency Department
Admission: EM | Admit: 2020-06-05 | Discharge: 2020-06-05 | Disposition: A | Discharge status: Skilled Nursing Facility | Payer: Medicare Other | Attending: Student in an Organized Health Care Education/Training Program | Admitting: Student in an Organized Health Care Education/Training Program

## 2020-06-05 ENCOUNTER — Telehealth: Payer: Self-pay | Admitting: Family Medicine

## 2020-06-05 DIAGNOSIS — R451 Restlessness and agitation: Secondary | ICD-10-CM | POA: Insufficient documentation

## 2020-06-05 DIAGNOSIS — I10 Essential (primary) hypertension: Secondary | ICD-10-CM | POA: Diagnosis not present

## 2020-06-05 DIAGNOSIS — Z87891 Personal history of nicotine dependence: Secondary | ICD-10-CM | POA: Insufficient documentation

## 2020-06-05 DIAGNOSIS — F0391 Unspecified dementia with behavioral disturbance: Secondary | ICD-10-CM | POA: Insufficient documentation

## 2020-06-05 DIAGNOSIS — Z853 Personal history of malignant neoplasm of breast: Secondary | ICD-10-CM | POA: Insufficient documentation

## 2020-06-05 DIAGNOSIS — F03918 Unspecified dementia, unspecified severity, with other behavioral disturbance: Secondary | ICD-10-CM

## 2020-06-05 LAB — COMPREHENSIVE METABOLIC PANEL
ALT: 17 U/L (ref 0–44)
AST: 27 U/L (ref 15–41)
Albumin: 4.1 g/dL (ref 3.5–5.0)
Alkaline Phosphatase: 74 U/L (ref 38–126)
Anion gap: 10 (ref 5–15)
BUN: 23 mg/dL (ref 8–23)
CO2: 32 mmol/L (ref 22–32)
Calcium: 9.2 mg/dL (ref 8.9–10.3)
Chloride: 105 mmol/L (ref 98–111)
Creatinine, Ser: 0.43 mg/dL — ABNORMAL LOW (ref 0.44–1.00)
GFR, Estimated: >60 mL/min (ref >60)
Glucose, Bld: 89 mg/dL (ref 70–99)
Potassium: 3.5 mmol/L (ref 3.5–5.1)
Sodium: 147 mmol/L — ABNORMAL HIGH (ref 135–145)
Total Bilirubin: 0.8 mg/dL (ref 0.3–1.2)
Total Protein: 7 g/dL (ref 6.5–8.1)

## 2020-06-05 LAB — CBC WITH DIFFERENTIAL/PLATELET
Abs Immature Granulocytes: 0.01 10*3/uL (ref 0.00–0.07)
Basophils Absolute: 0 10*3/uL (ref 0.0–0.1)
Basophils Relative: 1 %
Eosinophils Absolute: 0.1 10*3/uL (ref 0.0–0.5)
Eosinophils Relative: 1 %
HCT: 43.7 % (ref 36.0–46.0)
Hemoglobin: 14.2 g/dL (ref 12.0–15.0)
Immature Granulocytes: 0 %
Lymphocytes Relative: 34 %
Lymphs Abs: 1.9 10*3/uL (ref 0.7–4.0)
MCH: 32.4 pg (ref 26.0–34.0)
MCHC: 32.5 g/dL (ref 30.0–36.0)
MCV: 99.8 fL (ref 80.0–100.0)
Monocytes Absolute: 0.5 10*3/uL (ref 0.1–1.0)
Monocytes Relative: 8 %
Neutro Abs: 3.1 10*3/uL (ref 1.7–7.7)
Neutrophils Relative %: 56 %
Platelets: 162 10*3/uL (ref 150–400)
RBC: 4.38 MIL/uL (ref 3.87–5.11)
RDW: 14.1 % (ref 11.5–15.5)
WBC: 5.5 10*3/uL (ref 4.0–10.5)
nRBC: 0 % (ref 0.0–0.2)

## 2020-06-05 LAB — VALPROIC ACID LEVEL: Valproic Acid Lvl: 72 ug/mL (ref 50.0–100.0)

## 2020-06-05 MED ORDER — QUETIAPINE FUMARATE 25 MG PO TABS: 25.0000 mg | ORAL_TABLET | Freq: Every day | ORAL | Status: DC

## 2020-06-05 MED ORDER — RISPERIDONE 0.5 MG PO TBDP: 0.5000 mg | ORAL_TABLET | Freq: Every day | ORAL | 0 refills | Status: DC | PRN

## 2020-06-05 MED ORDER — RISPERIDONE 0.5 MG PO TBDP: 1.0000 mg | ORAL_TABLET | Freq: Every day | ORAL | Status: DC

## 2020-06-05 MED ORDER — RISPERIDONE 1 MG PO TBDP: 1.0000 mg | ORAL_TABLET | Freq: Every day | ORAL | 0 refills | Status: DC

## 2020-06-05 MED ORDER — RISPERIDONE 0.5 MG PO TBDP: 0.5000 mg | ORAL_TABLET | Freq: Every day | ORAL | Status: DC | PRN

## 2020-06-05 MED ORDER — HALOPERIDOL LACTATE 5 MG/ML IJ SOLN
5.0000 mg | Freq: Once | INTRAMUSCULAR | Status: AC
  Administered 2020-06-05: 5 mg via INTRAMUSCULAR
  Filled 2020-06-05: qty 1

## 2020-06-05 NOTE — ED Provider Notes (Signed)
[facility] Emergency Department Provider Note    First MD Initiated Contact with Patient 06/05/20 1052     (approximate)  I have reviewed the triage vital signs and the nursing notes.   HISTORY  Chief Complaint Aggressive Behavior  Level V Caveat:  Agitation - dementia  HPI Karen Ross is a 74 y.o. 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  Per review of medical record it sounds like she has had increasing agitation since her Depakote was recently increased.  Facility tried to call her PCP to get some medication recommendations and as needed's but they are unable to receive faxes so she was sent to the ER for further evaluation.  Patient is very poor historian.  She is swatting at the nurses but is able to be redirected.  No report of fever.  No report of pain.    Past Medical History:  Diagnosis Date  . Allergy   . Arthritis   . Breast cancer ([place])    in 1977  (Radical RT Mastectomy) and Tissue Removal LT Breast  . Cancer ([place])   . GERD (gastroesophageal reflux disease)   . History of breast cancer   . Hx of colonic polyps   . Hypertension   . Osteopenia   . Raynaud phenomenon    Family History  Problem Relation Age of Onset  . Osteoporosis Mother   . Osteoporosis Sister    Past Surgical History:  Procedure Laterality Date  . BREAST SURGERY     Radical RT Masctectomy; Tissue Removal LT  . COLONOSCOPY    . COLONOSCOPY WITH PROPOFOL N/A 12/01/2015   Procedure: COLONOSCOPY WITH PROPOFOL;  Surgeon: Manya Silvas, MD;  Location: [facility] ENDOSCOPY;  Service: Endoscopy;  Laterality: N/A;  . DILATION AND CURETTAGE OF UTERUS    . MASTECTOMY     radial rt.side/tissue removal on left  . MASTECTOMY Bilateral    radial R side; tissue removal L side  . POLYPECTOMY    . TUBAL LIGATION     Patient Active Problem List   Diagnosis Date Noted  .  Dementia with aggressive behavior ([place]) 06/05/2020  . Mild cognitive impairment with memory loss 12/25/2017  . Dementia with Lewy bodies ([place]) 11/20/2017  . Advanced directives, counseling/discussion 06/24/2014  . Raynaud phenomenon   . Routine general medical examination at a health care facility 05/25/2011  . Generalized osteoarthrosis, involving multiple sites 06/11/2008  . COLONIC POLYPS, ADENOMATOUS 03/26/2007  . Essential hypertension, benign 03/26/2007  . ALLERGIC RHINITIS 03/26/2007  . Osteopenia 03/26/2007  . BREAST CANCER, HX OF 03/26/2007      Prior to Admission medications   Medication Sig Start Date End Date Taking? Authorizing Provider  donepezil (ARICEPT) 10 MG tablet Take 10 mg by mouth at bedtime.   Yes [provider]  risperiDONE (RISPERDAL M-TAB) 0.5 MG disintegrating tablet Take 1 tablet (0.5 mg total) by mouth daily as needed. 06/05/20   Merlyn Lot, MD  risperiDONE (RISPERDAL M-TAB) 1 MG disintegrating tablet Take 1 tablet (1 mg total) by mouth at bedtime. 06/05/20   Merlyn Lot, MD    Allergies Sulfonamide derivatives    Social History Social History   Tobacco Use  . Smoking status: Former Smoker    Quit date: 08/02/1996    Years since quitting: 23.8  . Smokeless tobacco: Never Used  Substance Use Topics  . Alcohol use: Yes    Alcohol/week:  17.0 standard drinks    Types: 2 Glasses of wine, 15 Standard drinks or equivalent per week  . Drug use: No    Review of Systems Patient denies headaches, rhinorrhea, blurry vision, numbness, shortness of breath, chest pain, edema, cough, abdominal pain, nausea, vomiting, diarrhea, dysuria, fevers, rashes or hallucinations unless otherwise stated above in HPI. ____________________________________________   PHYSICAL EXAM:  VITAL SIGNS: Vitals:   06/05/20 1215 06/05/20 1230  BP:    Pulse: 70 92  Resp:    Temp:    SpO2: 96% 99%    Constitutional: Alert , agitated,  Disoriented  x3 Eyes: Conjunctivae are normal.  Head: Atraumatic. Nose: No congestion/rhinnorhea. Mouth/Throat: Mucous membranes are moist.   Neck: No stridor. Painless ROM.  Cardiovascular: Normal rate, regular rhythm. Grossly normal heart sounds.  Good peripheral circulation. Respiratory: Normal respiratory effort.  No retractions. Lungs CTAB. Gastrointestinal: Soft and nontender. No distention. No abdominal bruits. No CVA tenderness. Genitourinary:  Musculoskeletal: No lower extremity tenderness nor edema.  No joint effusions. Neurologic:  Normal speech and language. No gross focal neurologic deficits are appreciated. No facial droop Skin:  Skin is warm, dry and intact. No rash noted. Psychiatric: Patient agitated and is swinging at the nurses but is able to be redirected for short period of time. ____________________________________________   LABS (all labs ordered are listed, but only abnormal results are displayed)  Results for orders placed or performed during the hospital encounter of 06/05/20 (from the past 24 hour(s))  Valproic acid level     Status: None   Collection Time: 06/05/20 12:04 PM  Result Value Ref Range   Valproic Acid Lvl 72 50.0 - 100.0 ug/mL  CBC with Differential     Status: None   Collection Time: 06/05/20 12:04 PM  Result Value Ref Range   WBC 5.5 4.0 - 10.5 K/uL   RBC 4.38 3.87 - 5.11 MIL/uL   Hemoglobin 14.2 12.0 - 15.0 g/dL   HCT 43.7 36 - 46 %   MCV 99.8 80.0 - 100.0 fL   MCH 32.4 26.0 - 34.0 pg   MCHC 32.5 30.0 - 36.0 g/dL   RDW 14.1 11.5 - 15.5 %   Platelets 162 150 - 400 K/uL   nRBC 0.0 0.0 - 0.2 %   Neutrophils Relative % 56 %   Neutro Abs 3.1 1.7 - 7.7 K/uL   Lymphocytes Relative 34 %   Lymphs Abs 1.9 0.7 - 4.0 K/uL   Monocytes Relative 8 %   Monocytes Absolute 0.5 0.1 - 1.0 K/uL   Eosinophils Relative 1 %   Eosinophils Absolute 0.1 0.0 - 0.5 K/uL   Basophils Relative 1 %   Basophils Absolute 0.0 0.0 - 0.1 K/uL   Immature Granulocytes 0 %   Abs  Immature Granulocytes 0.01 0.00 - 0.07 K/uL  Comprehensive metabolic panel     Status: Abnormal   Collection Time: 06/05/20 12:04 PM  Result Value Ref Range   Sodium 147 (H) 135 - 145 mmol/L   Potassium 3.5 3.5 - 5.1 mmol/L   Chloride 105 98 - 111 mmol/L   CO2 32 22 - 32 mmol/L   Glucose, Bld 89 70 - 99 mg/dL   BUN 23 8 - 23 mg/dL   Creatinine, Ser 0.43 (L) 0.44 - 1.00 mg/dL   Calcium 9.2 8.9 - 10.3 mg/dL   Total Protein 7.0 6.5 - 8.1 g/dL   Albumin 4.1 3.5 - 5.0 g/dL   AST 27 15 - 41 U/L   ALT  17 0 - 44 U/L   Alkaline Phosphatase 74 38 - 126 U/L   Total Bilirubin 0.8 0.3 - 1.2 mg/dL   GFR, Estimated >60 >60 mL/min   Anion gap 10 5 - 15   ____________________________________________ ____________________________________________  RADIOLOGY   ____________________________________________   PROCEDURES  Procedure(s) performed:  Procedures    Critical Care performed: no ____________________________________________   INITIAL IMPRESSION / ASSESSMENT AND PLAN / ED COURSE  Pertinent labs & imaging results that were available during my care of the patient were reviewed by me and considered in my medical decision making (see chart for details).   DDX: Dementia, medication noncompliance, medication effect, sundowning, electrolyte abnormality  MADILINE SAFFRAN is a 74 y.o. who presents to the ED with worsening agitation despite medication management by her PCP at the facility.  Patient is redirectable.  Will be given a dose of Haldol she missed her morning Seroquel.  Will check blood work.  Her exam is otherwise reassuring no report of any falls.  I suspect this is worsening dementia.  Clinical Course as of Jun 05 1514  Sun Jun 05, 2020  1348 Patient now much more calm she is not combative.  We will give her morning Seroquel.  She is tolerating p.o.  Given the recent medication changes as an outpatient reported worsening agitation I will consult psychiatry for further medication  recommendations but are not seeing indication for hospitalization at this time.   [PR]    Clinical Course User Index [PR] Merlyn Lot, MD    The patient was evaluated in Emergency Department today for the symptoms described in the history of present illness. He/she was evaluated in the context of the global COVID-19 pandemic, which necessitated consideration that the patient might be at risk for infection with the SARS-CoV-2 virus that causes COVID-19. Institutional protocols and algorithms that pertain to the evaluation of patients at risk for COVID-19 are in a state of rapid change based on information released by regulatory bodies including the CDC and federal and state organizations. These policies and algorithms were followed during the patient's care in the ED.  As part of my medical decision making, I reviewed the following data within the [place] notes reviewed and incorporated, Labs reviewed, notes from prior ED visits and [place] Controlled Substance Database   ____________________________________________   FINAL CLINICAL IMPRESSION(S) / ED DIAGNOSES  Final diagnoses:  Agitation      NEW MEDICATIONS STARTED DURING THIS VISIT:  New Prescriptions   RISPERIDONE (RISPERDAL M-TAB) 0.5 MG DISINTEGRATING TABLET    Take 1 tablet (0.5 mg total) by mouth daily as needed.   RISPERIDONE (RISPERDAL M-TAB) 1 MG DISINTEGRATING TABLET    Take 1 tablet (1 mg total) by mouth at bedtime.     Note:  This document was prepared using Dragon voice recognition software and may include unintentional dictation errors.    Merlyn Lot, MD 06/05/20 1515
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
## 2020-06-05 NOTE — Discharge Instructions (Signed)
Has been evaluated by psychiatry and they are recommending switching Seroquel to Risperdal.  Prescription for this will be included.  Can decrease or discontinue Depakote at the discretion of PCP.
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
## 2020-06-05 NOTE — ED Notes (Signed)
Pt not able to sign for d/c due to AMS- husband given copy of papers

## 2020-06-05 NOTE — Telephone Encounter (Signed)
Received call from team health who put me in touch with nurse at [facility] where pt lives  She has dementia  Her depakote was recently increased and since then she has been much more agitated, now violent  She also takes seroquel which does not seem to be helping  UA was clear   They cannot take any orders for change in care verbally (only by fax)  I do not have access to a fax machine right now so cannot place any orders   Only option is to send to ER for eval unfortunately - so this is what they will do

## 2020-06-05 NOTE — ED Notes (Signed)
Pt resting with eyes closed and even respirations- pt husband at bedside

## 2020-06-05 NOTE — Consult Note (Signed)
[facility]iatry Consult   Reason for Consult: Consult for 74 year old woman with a history of dementia brought to the emergency room because of agitated behavior Referring Physician: Quentin Cornwall Patient Identification: Karen Ross MRN:  [id number] Principal Diagnosis: Dementia with aggressive behavior ([place]) Diagnosis:  Principal Problem:   Dementia with aggressive behavior ([place])   Total Time spent with patient: 30 minutes  Subjective:   Karen Ross is a 74 y.o. female patient admitted with "I don't know".  HPI: Patient seen chart reviewed. Spoke with patient's husband. Spoke with emergency room doctor. 74 year old woman with a known history of mild to moderate dementia who has been living at [facility]. Patient reportedly has been having more agitated behaviors which have been escalating. Primary care doctor and consultation with neurology has been treating the patient with Depakote and Seroquel but behaviors are escalating. Patient brought to the ER evidently because of further escalation with striking out at other people. Patient seen. She is lying in a stretcher in an emergency room bay. Eyes closed. Appears at first to be asleep but arouses easily to her name. Brief eye contact and then looks away. I asked her if she knew where she was and she told me that she was at her living facility. Refused to believe me when I told her she was at the hospital. Patient did not know why she was in the hospital. Became irritated after just a couple questions and stopped answering. Husband reports that the patient has been having more behaviors from. From what he understands it mostly happens at nighttime. He does not know of any specific stressor or reason why things would be getting worse. Notes from providers suggest that they are concerned the Depakote could be worsening things given that she has gotten worse since taking it. Patient evidently had some history of drinking a bit too much but  husband reports there is no way she could be drinking currently. Nurses describe pupils is pinpoint. No evidence of or report of opiates or other drugs being given. Nothing in the controlled substance database.  Past Psychiatric History: Only past psychiatric history was concerned about the dementia. Has been seen by Dr. Manuella Ghazi with neurology. Recent thoughts that she may be having dementia with Lewy bodies. Attempts to control behavior had recently been failing to prevent her from getting aggressive. Other than that no known past psychiatric history.  Risk to Self:   Risk to Others:   Prior Inpatient Therapy:   Prior Outpatient Therapy:    Past Medical History:  Past Medical History:  Diagnosis Date  . Allergy   . Arthritis   . Breast cancer ([place])    in 1977  (Radical RT Mastectomy) and Tissue Removal LT Breast  . Cancer ([place])   . GERD (gastroesophageal reflux disease)   . History of breast cancer   . Hx of colonic polyps   . Hypertension   . Osteopenia   . Raynaud phenomenon     Past Surgical History:  Procedure Laterality Date  . BREAST SURGERY     Radical RT Masctectomy; Tissue Removal LT  . COLONOSCOPY    . COLONOSCOPY WITH PROPOFOL N/A 12/01/2015   Procedure: COLONOSCOPY WITH PROPOFOL;  Surgeon: Manya Silvas, MD;  Location: [facility] ENDOSCOPY;  Service: Endoscopy;  Laterality: N/A;  . DILATION AND CURETTAGE OF UTERUS    . MASTECTOMY     radial rt.side/tissue removal on left  . MASTECTOMY Bilateral    radial R side; tissue removal L  side  . POLYPECTOMY    . TUBAL LIGATION     Family History:  Family History  Problem Relation Age of Onset  . Osteoporosis Mother   . Osteoporosis Sister    Family Psychiatric  History: None reported Social History:  Social History   Substance and Sexual Activity  Alcohol Use Yes  . Alcohol/week: 17.0 standard drinks  . Types: 2 Glasses of wine, 15 Standard drinks or equivalent per week     Social History   Substance and Sexual  Activity  Drug Use No    Social History   Socioeconomic History  . Marital status: Married    Spouse name: Not on file  . Number of children: 2  . Years of education: Not on file  . Highest education level: Not on file  Occupational History  . Occupation: Lexicographer    Comment: Retired  Tobacco Use  . Smoking status: Former Smoker    Quit date: 08/02/1996    Years since quitting: 23.8  . Smokeless tobacco: Never Used  Substance and Sexual Activity  . Alcohol use: Yes    Alcohol/week: 17.0 standard drinks    Types: 2 Glasses of wine, 15 Standard drinks or equivalent per week  . Drug use: No  . Sexual activity: Not on file  Other Topics Concern  . Not on file  Social History Narrative   No living will   No health care POA--- husband to make decisions if needed---alternate son Elberta Fortis   Would accept resuscitation but no prolonged artificial ventilation   No feeding tube if cognitively unaware   Social Determinants of Health   Financial Resource Strain:   . Difficulty of Paying Living Expenses: Not on file  Food Insecurity:   . Worried About Charity fundraiser in the Last Year: Not on file  . Ran Out of Food in the Last Year: Not on file  Transportation Needs:   . Lack of Transportation (Medical): Not on file  . Lack of Transportation (Non-Medical): Not on file  Physical Activity:   . Days of Exercise per Week: Not on file  . Minutes of Exercise per Session: Not on file  Stress:   . Feeling of Stress : Not on file  Social Connections:   . Frequency of Communication with Friends and Family: Not on file  . Frequency of Social Gatherings with Friends and Family: Not on file  . Attends Religious Services: Not on file  . Active Member of Clubs or Organizations: Not on file  . Attends Archivist Meetings: Not on file  . Marital Status: Not on file   Additional Social History:    Allergies:   Allergies  Allergen Reactions  . Sulfonamide Derivatives      REACTION: UNSPECIFIED; pt states it "didn't agree with me"    Labs:  Results for orders placed or performed during the hospital encounter of 06/05/20 (from the past 48 hour(s))  Valproic acid level     Status: None   Collection Time: 06/05/20 12:04 PM  Result Value Ref Range   Valproic Acid Lvl 72 50.0 - 100.0 ug/mL    Comment: Performed at [facility], [address] [place],  [address]  CBC with Differential     Status: None   Collection Time: 06/05/20 12:04 PM  Result Value Ref Range   WBC 5.5 4.0 - 10.5 K/uL   RBC 4.38 3.87 - 5.11 MIL/uL   Hemoglobin 14.2 12.0 - 15.0 g/dL  HCT 43.7 36 - 46 %   MCV 99.8 80.0 - 100.0 fL   MCH 32.4 26.0 - 34.0 pg   MCHC 32.5 30.0 - 36.0 g/dL   RDW 14.1 11.5 - 15.5 %   Platelets 162 150 - 400 K/uL   nRBC 0.0 0.0 - 0.2 %   Neutrophils Relative % 56 %   Neutro Abs 3.1 1.7 - 7.7 K/uL   Lymphocytes Relative 34 %   Lymphs Abs 1.9 0.7 - 4.0 K/uL   Monocytes Relative 8 %   Monocytes Absolute 0.5 0.1 - 1.0 K/uL   Eosinophils Relative 1 %   Eosinophils Absolute 0.1 0.0 - 0.5 K/uL   Basophils Relative 1 %   Basophils Absolute 0.0 0.0 - 0.1 K/uL   Immature Granulocytes 0 %   Abs Immature Granulocytes 0.01 0.00 - 0.07 K/uL    Comment: Performed at [facility], [address] [place], [place] [address]  Comprehensive metabolic panel     Status: Abnormal   Collection Time: 06/05/20 12:04 PM  Result Value Ref Range   Sodium 147 (H) 135 - 145 mmol/L   Potassium 3.5 3.5 - 5.1 mmol/L   Chloride 105 98 - 111 mmol/L   CO2 32 22 - 32 mmol/L   Glucose, Bld 89 70 - 99 mg/dL    Comment: Glucose reference range applies only to samples taken after fasting for at least 8 hours.   BUN 23 8 - 23 mg/dL   Creatinine, Ser 0.43 (L) 0.44 - 1.00 mg/dL   Calcium 9.2 8.9 - 10.3 mg/dL   Total Protein 7.0 6.5 - 8.1 g/dL   Albumin 4.1 3.5 - 5.0 g/dL   AST 27 15 - 41 U/L   ALT 17 0 - 44 U/L   Alkaline Phosphatase 74 38 - 126 U/L    Total Bilirubin 0.8 0.3 - 1.2 mg/dL   GFR, Estimated >60 >60 mL/min    Comment: (NOTE) Calculated using the CKD-EPI Creatinine Equation (2021)    Anion gap 10 5 - 15    Comment: Performed at [facility], [address] [place], [place] [address]    Current Facility-Administered Medications  Medication Dose Route Frequency Provider Last Rate Last Admin  . QUEtiapine (SEROQUEL) tablet 25 mg  25 mg Oral QHS Merlyn Lot, MD       Current Outpatient Medications  Medication Sig Dispense Refill  . donepezil (ARICEPT) 10 MG tablet Take 10 mg by mouth at bedtime.    Marland Kitchen QUEtiapine (SEROQUEL) 25 MG tablet Take 12.5 mg by mouth every evening.    Marland Kitchen QUEtiapine (SEROQUEL) 25 MG tablet Take 25 mg by mouth daily.      Musculoskeletal: Strength & Muscle Tone: within normal limits Gait & Station: Unknown Patient leans: Unknown  Psychiatric Specialty Exam: Physical Exam Vitals and nursing note reviewed.  Constitutional:      Appearance: She is well-developed.  HENT:     Head: Normocephalic and atraumatic.  Eyes:     Conjunctiva/sclera: Conjunctivae normal.     Pupils: Pupils are equal, round, and reactive to light.  Cardiovascular:     Heart sounds: Normal heart sounds.  Pulmonary:     Effort: Pulmonary effort is normal.  Abdominal:     Palpations: Abdomen is soft.  Musculoskeletal:        General: Normal range of motion.     Cervical back: Normal range of motion.  Skin:    General: Skin is warm and dry.  Neurological:  Mental Status: She is alert.  Psychiatric:        Attention and Perception: She is inattentive.        Mood and Affect: Affect is inappropriate.        Speech: Speech is delayed.        Behavior: Behavior is withdrawn.        Thought Content: Thought content does not include homicidal or suicidal ideation.        Cognition and Memory: Cognition is impaired.        Judgment: Judgment is inappropriate.     Review of Systems  Unable to perform  ROS: Dementia    Blood pressure (!) 194/93, pulse 92, temperature (!) 97.3 F (36.3 C), temperature source Axillary, resp. rate 20, height 5\' 2"  (1.575 m), weight 63.5 kg, SpO2 99 %.Body mass index is 25.61 kg/m.  General Appearance: Casual  Eye Contact:  None  Speech:  Slow  Volume:  Decreased  Mood:  Irritable  Affect:  Constricted  Thought Process:  NA  Orientation:  Negative  Thought Content:  Negative  Suicidal Thoughts:  No  Homicidal Thoughts:  No  Memory:  Negative  Judgement:  Negative  Insight:  Negative  Psychomotor Activity:  Decreased  Concentration:  Concentration: Poor  Recall:  Poor  Fund of Knowledge:  Poor  Language:  Fair  Akathisia:  No  Handed:  Right  AIMS (if indicated):     Assets:  Desire for Improvement Housing Social Support  ADL's:  Impaired  Cognition:  Impaired,  Moderate  Sleep:        Treatment Plan Summary: Medication management and Plan Consultation for patient with mild to moderate dementia who is been showing more agitation. Patient not able to provide any history. Husband also does not know of any recent stressors other than her ongoing dementia. Difficult assessment as the patient is uncooperative. Not clear to me whether she is significantly off of her baseline compared to usual. From what I am seeing in the chart her only recent medicines have been the Depakote and the Seroquel. Treatment in this situation largely a matter of first ruling out any medical problems which I assume will be done in the emergency room. Nanine Means should do their best I am sure to maintain a supportive environment with appropriate light changes and cues. As far as medication, her Depakote level is 76 which is in the normal therapeutic range but does not seem to be having the hoped for effect on her behavior. I would support discontinuing the Depakote for now. Antipsychotics for behavior problems usually more reliable. Seroquel often a first choice because of its  lower risk of causing dystonic reactions but also has some risk of falls and arrhythmias and sometimes it is too sedating. I would suggest discontinuing the Seroquel and replacing it with low-dose Risperdal 1 mg at night half milligram in the day as needed for agitation. Treatment is empirical and we will follow-up as needed.  Disposition: Patient does not meet criteria for psychiatric inpatient admission.  Alethia Berthold, MD 06/05/2020 2:31 PM

## 2020-06-06 NOTE — Telephone Encounter (Signed)
Please take me off as PCP

## 2020-06-06 NOTE — Telephone Encounter (Signed)
[facility] Night - Client TELEPHONE ADVICE RECORD [facility]urse Patient Name: Karen Ross Gender: Female DOB: 1945-12-10 Age: 74 Y 66 D Return Phone Number: [phone] (Alternate) Address: City/State/Zip: [place] [address] Client [facility] Night - Client Client Site [place] - Night Contact Type Call Who Is Calling Patient / Member / Family / Caregiver Call Type Triage / Clinical Caller Name Curtis Sites Relationship To Patient Care Giver Return Phone Number [phone] (Alternate) Chief Complaint Angry or Violent Behavior Reason for Call Symptomatic / Request for [place] states she has a resident that has had a medication increase. She is aggressive with violent behavior. Additional Marketing executive No Nurse Assessment Nurse: Chestine Spore, RN, [place] Date/Time (Eastern Time): 06/05/2020 9:15:44 AM Confirm and document reason for call. If symptomatic, describe symptoms. ---caller states patient having increased violent behavior. depakote increased from 125 mg to 250 mg on 05/31/20. violent behavior worsened. checked for UTI and was negative Does the patient have any new or worsening symptoms? ---Yes Will a triage be completed? ---Yes Related visit to physician within the last 2 weeks? ---No Does the PT have any chronic conditions? (i.e. diabetes, asthma, this includes High risk factors for pregnancy, etc.) ---Yes List chronic conditions. ---dementia Is this a behavioral health or substance abuse call? ---Yes Are you having any thoughts or feelings of harming or killing yourself or someone else? ---No Are you currently experiencing any physical discomfort that you think may be related to the use of alcohol or other drugs? (use substance abuse or alcohol abuse guidelines. These include withdrawal symptoms) ---No Do you worry that you may be  hearing or seeing things that others do not? ---No Do you take medications for your condition(s)? ---No PLEASE NOTE: All timestamps contained within this report are represented as [place] Standard Time. CONFIDENTIALTY NOTICE: This fax transmission is intended only for the addressee. It contains information that is legally privileged, confidential or otherwise protected from use or disclosure. If you are not the intended recipient, you are strictly prohibited from reviewing, disclosing, copying using or disseminating any of this information or taking any action in reliance on or regarding this information. If you have received this fax in error, please notify us immediately by telephone so that we can arrange for its return to [place]. Phone: [phone], Toll-Free: [phone], Fax: [phone] Page: 2 of 2 Call Id: [id number] Guidelines Guideline Title Affirmed Question Affirmed Notes Nurse Date/Time Eilene Ghazi Time) Confusion - Delirium [1] Longstanding confusion (e.g., dementia, stroke) AND [2] worsening Chestine Spore, RN, [place] 06/05/2020 9:21:49 AM Disp. Time Eilene Ghazi Time) Disposition Final User 06/05/2020 9:25:03 AM Paged On Call back to [facility], [place], [place] 06/05/2020 9:23:28 AM See HCP within 4 Hours (or PCP triage) Yes Chestine Spore, RN, Silvestre Moment Disagree/Comply Comply Caller Understands Yes PreDisposition Call Doctor Care Advice Given Per Guideline SEE HCP (OR PCP TRIAGE) WITHIN 4 HOURS: CALL BACK IF: * You become worse CARE ADVICE given per Confusion- Delirium (Adult) guideline. Referrals GO TO FACILITY UNDECIDED Paging DoctorName Phone DateTime Result/Outcome Message Type Notes Loura Pardon - [place] [phone] 06/05/2020 9:25:03 AM Paged On Call Back to Call Center Doctor Paged please call Maudie Mercury RN [phone] thank you Loura Pardon - MD 06/05/2020 9:36:33 AM Spoke with On Call - General Message Result MD warm transferred to facility RN per MDs  request

## 2020-06-06 NOTE — Telephone Encounter (Signed)
I spoke with Tiffany at [facility] to get update on pt; Tiffany said the pt is not Delray Alt but Vernona Rieger DOB [birth year]. I asked how pt was doing and Jonelle Sidle said that pt has switched to the house doctor for Nanine Means  And nothing further needed now and if anything needed someone from [facility] will cb. FYI to DR Silvio Pate. Pt did go to [facility] ED on 06/05/20.

## 2020-08-10 ENCOUNTER — Emergency Department: Payer: Medicare Other

## 2020-08-10 ENCOUNTER — Inpatient Hospital Stay
Admission: EM | Admit: 2020-08-10 | Discharge: 2020-08-17 | DRG: 682 | Disposition: A | Discharge status: Hospice | Payer: Medicare Other | Source: Skilled Nursing Facility | Attending: Internal Medicine | Admitting: Internal Medicine

## 2020-08-10 DIAGNOSIS — E870 Hyperosmolality and hypernatremia: Secondary | ICD-10-CM | POA: Diagnosis present

## 2020-08-10 DIAGNOSIS — E860 Dehydration: Secondary | ICD-10-CM | POA: Diagnosis present

## 2020-08-10 DIAGNOSIS — Z66 Do not resuscitate: Secondary | ICD-10-CM

## 2020-08-10 DIAGNOSIS — Z20822 Contact with and (suspected) exposure to covid-19: Secondary | ICD-10-CM | POA: Diagnosis present

## 2020-08-10 DIAGNOSIS — E559 Vitamin D deficiency, unspecified: Secondary | ICD-10-CM | POA: Diagnosis present

## 2020-08-10 DIAGNOSIS — M1990 Unspecified osteoarthritis, unspecified site: Secondary | ICD-10-CM | POA: Diagnosis present

## 2020-08-10 DIAGNOSIS — N179 Acute kidney failure, unspecified: Secondary | ICD-10-CM | POA: Diagnosis present

## 2020-08-10 DIAGNOSIS — Z7189 Other specified counseling: Secondary | ICD-10-CM | POA: Diagnosis not present

## 2020-08-10 DIAGNOSIS — F0391 Unspecified dementia with behavioral disturbance: Secondary | ICD-10-CM | POA: Diagnosis not present

## 2020-08-10 DIAGNOSIS — N390 Urinary tract infection, site not specified: Secondary | ICD-10-CM | POA: Diagnosis present

## 2020-08-10 DIAGNOSIS — Z515 Encounter for palliative care: Secondary | ICD-10-CM

## 2020-08-10 DIAGNOSIS — Z9882 Breast implant status: Secondary | ICD-10-CM | POA: Diagnosis not present

## 2020-08-10 DIAGNOSIS — R296 Repeated falls: Secondary | ICD-10-CM | POA: Diagnosis present

## 2020-08-10 DIAGNOSIS — F0281 Dementia in other diseases classified elsewhere with behavioral disturbance: Secondary | ICD-10-CM | POA: Diagnosis present

## 2020-08-10 DIAGNOSIS — E43 Unspecified severe protein-calorie malnutrition: Secondary | ICD-10-CM | POA: Diagnosis present

## 2020-08-10 DIAGNOSIS — M8580 Other specified disorders of bone density and structure, unspecified site: Secondary | ICD-10-CM | POA: Diagnosis present

## 2020-08-10 DIAGNOSIS — G3183 Dementia with Lewy bodies: Secondary | ICD-10-CM | POA: Diagnosis present

## 2020-08-10 DIAGNOSIS — Z79899 Other long term (current) drug therapy: Secondary | ICD-10-CM

## 2020-08-10 DIAGNOSIS — I10 Essential (primary) hypertension: Secondary | ICD-10-CM | POA: Diagnosis present

## 2020-08-10 DIAGNOSIS — R627 Adult failure to thrive: Secondary | ICD-10-CM

## 2020-08-10 DIAGNOSIS — Z681 Body mass index (BMI) 19 or less, adult: Secondary | ICD-10-CM

## 2020-08-10 DIAGNOSIS — K219 Gastro-esophageal reflux disease without esophagitis: Secondary | ICD-10-CM | POA: Diagnosis present

## 2020-08-10 DIAGNOSIS — I7300 Raynaud's syndrome without gangrene: Secondary | ICD-10-CM | POA: Diagnosis present

## 2020-08-10 DIAGNOSIS — E876 Hypokalemia: Secondary | ICD-10-CM | POA: Diagnosis present

## 2020-08-10 DIAGNOSIS — I959 Hypotension, unspecified: Secondary | ICD-10-CM | POA: Diagnosis present

## 2020-08-10 DIAGNOSIS — Z882 Allergy status to sulfonamides status: Secondary | ICD-10-CM

## 2020-08-10 DIAGNOSIS — R4182 Altered mental status, unspecified: Secondary | ICD-10-CM

## 2020-08-10 DIAGNOSIS — Z853 Personal history of malignant neoplasm of breast: Secondary | ICD-10-CM

## 2020-08-10 DIAGNOSIS — Z87891 Personal history of nicotine dependence: Secondary | ICD-10-CM

## 2020-08-10 DIAGNOSIS — Z9011 Acquired absence of right breast and nipple: Secondary | ICD-10-CM

## 2020-08-10 DIAGNOSIS — F03918 Unspecified dementia, unspecified severity, with other behavioral disturbance: Secondary | ICD-10-CM

## 2020-08-10 DIAGNOSIS — G309 Alzheimer's disease, unspecified: Secondary | ICD-10-CM | POA: Diagnosis not present

## 2020-08-10 LAB — CBC
HCT: 47 % — ABNORMAL HIGH (ref 36.0–46.0)
Hemoglobin: 15 g/dL (ref 12.0–15.0)
MCH: 32.5 pg (ref 26.0–34.0)
MCHC: 31.9 g/dL (ref 30.0–36.0)
MCV: 101.7 fL — ABNORMAL HIGH (ref 80.0–100.0)
Platelets: 216 10*3/uL (ref 150–400)
RBC: 4.62 MIL/uL (ref 3.87–5.11)
RDW: 14.4 % (ref 11.5–15.5)
WBC: 10.9 10*3/uL — ABNORMAL HIGH (ref 4.0–10.5)
nRBC: 0 % (ref 0.0–0.2)

## 2020-08-10 LAB — CBC WITH DIFFERENTIAL/PLATELET
Abs Immature Granulocytes: 0.03 10*3/uL (ref 0.00–0.07)
Basophils Absolute: 0 10*3/uL (ref 0.0–0.1)
Basophils Relative: 0 %
Eosinophils Absolute: 0 10*3/uL (ref 0.0–0.5)
Eosinophils Relative: 0 %
HCT: 44.1 % (ref 36.0–46.0)
Hemoglobin: 13.8 g/dL (ref 12.0–15.0)
Immature Granulocytes: 0 %
Lymphocytes Relative: 16 %
Lymphs Abs: 1.7 10*3/uL (ref 0.7–4.0)
MCH: 32.5 pg (ref 26.0–34.0)
MCHC: 31.3 g/dL (ref 30.0–36.0)
MCV: 103.8 fL — ABNORMAL HIGH (ref 80.0–100.0)
Monocytes Absolute: 1.1 10*3/uL — ABNORMAL HIGH (ref 0.1–1.0)
Monocytes Relative: 11 %
Neutro Abs: 7.5 10*3/uL (ref 1.7–7.7)
Neutrophils Relative %: 73 %
Platelets: 197 10*3/uL (ref 150–400)
RBC: 4.25 MIL/uL (ref 3.87–5.11)
RDW: 14.5 % (ref 11.5–15.5)
WBC: 10.3 10*3/uL (ref 4.0–10.5)
nRBC: 0 % (ref 0.0–0.2)

## 2020-08-10 LAB — URINALYSIS, COMPLETE (UACMP) WITH MICROSCOPIC
Bacteria, UA: NONE SEEN
Bilirubin Urine: NEGATIVE
Glucose, UA: NEGATIVE mg/dL
Hgb urine dipstick: NEGATIVE
Ketones, ur: 5 mg/dL — AB
Leukocytes,Ua: NEGATIVE
Nitrite: NEGATIVE
Protein, ur: 30 mg/dL — AB
Specific Gravity, Urine: 1.03 (ref 1.005–1.030)
pH: 5 (ref 5.0–8.0)

## 2020-08-10 LAB — COMPREHENSIVE METABOLIC PANEL
ALT: 16 U/L (ref 0–44)
AST: 35 U/L (ref 15–41)
Albumin: 3.3 g/dL — ABNORMAL LOW (ref 3.5–5.0)
Alkaline Phosphatase: 75 U/L (ref 38–126)
Anion gap: 13 (ref 5–15)
BUN: 41 mg/dL — ABNORMAL HIGH (ref 8–23)
CO2: 35 mmol/L — ABNORMAL HIGH (ref 22–32)
Calcium: 8.4 mg/dL — ABNORMAL LOW (ref 8.9–10.3)
Chloride: 103 mmol/L (ref 98–111)
Creatinine, Ser: 0.99 mg/dL (ref 0.44–1.00)
GFR, Estimated: 59 mL/min — ABNORMAL LOW (ref >60)
Glucose, Bld: 91 mg/dL (ref 70–99)
Potassium: 2.2 mmol/L — CL (ref 3.5–5.1)
Sodium: 151 mmol/L — ABNORMAL HIGH (ref 135–145)
Total Bilirubin: 1 mg/dL (ref 0.3–1.2)
Total Protein: 6.3 g/dL — ABNORMAL LOW (ref 6.5–8.1)

## 2020-08-10 LAB — TROPONIN I (HIGH SENSITIVITY): Troponin I (High Sensitivity): 25 ng/L — ABNORMAL HIGH (ref <18)

## 2020-08-10 LAB — LACTIC ACID, PLASMA: Lactic Acid, Venous: 2.4 mmol/L (ref 0.5–1.9)

## 2020-08-10 LAB — MAGNESIUM: Magnesium: 2.3 mg/dL (ref 1.7–2.4)

## 2020-08-10 MED ORDER — ACETAMINOPHEN 500 MG PO TABS: 1000.0000 mg | ORAL_TABLET | Freq: Four times a day (QID) | ORAL | Status: DC | PRN

## 2020-08-10 MED ORDER — LACTATED RINGERS IV BOLUS
1000.0000 mL | Freq: Once | INTRAVENOUS | Status: AC
  Administered 2020-08-10: 1000 mL via INTRAVENOUS

## 2020-08-10 MED ORDER — SODIUM CHLORIDE 0.9% FLUSH
3.0000 mL | Freq: Two times a day (BID) | INTRAVENOUS | Status: DC
  Administered 2020-08-10 – 2020-08-16 (×13): 3 mL via INTRAVENOUS

## 2020-08-10 MED ORDER — POTASSIUM CL IN DEXTROSE 5% 20 MEQ/L IV SOLN
20.0000 meq | INTRAVENOUS | Status: DC
  Administered 2020-08-11 – 2020-08-14 (×5): 20 meq via INTRAVENOUS
  Filled 2020-08-10 (×8): qty 1000

## 2020-08-10 MED ORDER — ONDANSETRON HCL 4 MG/2ML IJ SOLN: 4.0000 mg | Freq: Four times a day (QID) | INTRAMUSCULAR | Status: DC | PRN

## 2020-08-10 MED ORDER — THIAMINE HCL 100 MG/ML IJ SOLN
Freq: Once | INTRAVENOUS | Status: AC
  Filled 2020-08-10: qty 1000

## 2020-08-10 MED ORDER — POTASSIUM CHLORIDE 10 MEQ/100ML IV SOLN
10.0000 meq | Freq: Once | INTRAVENOUS | Status: AC
  Administered 2020-08-10: 10 meq via INTRAVENOUS
  Filled 2020-08-10: qty 100

## 2020-08-10 MED ORDER — SODIUM CHLORIDE 0.9 % IV SOLN
1.0000 g | Freq: Once | INTRAVENOUS | Status: AC
  Administered 2020-08-10: 1 g via INTRAVENOUS
  Filled 2020-08-10: qty 10

## 2020-08-10 MED ORDER — HEPARIN SODIUM (PORCINE) 5000 UNIT/ML IJ SOLN
5000.0000 [IU] | Freq: Three times a day (TID) | INTRAMUSCULAR | Status: DC
  Administered 2020-08-10 – 2020-08-15 (×12): 5000 [IU] via SUBCUTANEOUS
  Filled 2020-08-10 (×13): qty 1

## 2020-08-10 MED ORDER — DONEPEZIL HCL 5 MG PO TABS
10.0000 mg | ORAL_TABLET | Freq: Every day | ORAL | Status: DC
  Administered 2020-08-11 – 2020-08-14 (×4): 10 mg via ORAL
  Filled 2020-08-10 (×5): qty 2

## 2020-08-10 MED ORDER — ATORVASTATIN CALCIUM 10 MG PO TABS
10.0000 mg | ORAL_TABLET | Freq: Every day | ORAL | Status: DC
  Administered 2020-08-10 – 2020-08-13 (×4): 10 mg via ORAL
  Filled 2020-08-10 (×4): qty 1

## 2020-08-10 MED ORDER — SERTRALINE HCL 50 MG PO TABS
100.0000 mg | ORAL_TABLET | Freq: Every day | ORAL | Status: DC
  Administered 2020-08-11 – 2020-08-14 (×4): 100 mg via ORAL
  Filled 2020-08-10 (×4): qty 2
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
## 2020-08-10 NOTE — ED Provider Notes (Signed)
[facility] Emergency Department Provider Note   ____________________________________________   Event Date/Time   First MD Initiated Contact with Patient 08/10/20 1539     (approximate)  I have reviewed the triage vital signs and the nursing notes.   HISTORY  Chief Complaint Altered Mental Status    HPI BRIER MELGAR is a 75 y.o. female who comes from [facility] for altered mental status.  Patient is awake and talking but when I asked her why she is not eating she asked me why are not you hungry.  And then she says you need to eat to me.  When I asked her to open her mouth to say she will not do it.  She has apparently been refusing to eat for several days and has become more weak over that time.  Her potassium is now very low she is hypotensive with a blood pressure 97/57.  She does not say that she has any chest or belly pain or any other complaints.         Past Medical History:  Diagnosis Date  . Allergy   . Arthritis   . Breast cancer ([place])    in 1977  (Radical RT Mastectomy) and Tissue Removal LT Breast  . Cancer ([place])   . GERD (gastroesophageal reflux disease)   . History of breast cancer   . Hx of colonic polyps   . Hypertension   . Osteopenia   . Raynaud phenomenon     Patient Active Problem List   Diagnosis Date Noted  . Dementia with aggressive behavior ([place]) 06/05/2020  . Mild cognitive impairment with memory loss 12/25/2017  . Dementia with Lewy bodies ([place]) 11/20/2017  . Advanced directives, counseling/discussion 06/24/2014  . Raynaud phenomenon   . Routine general medical examination at a health care facility 05/25/2011  . Generalized osteoarthrosis, involving multiple sites 06/11/2008  . COLONIC POLYPS, ADENOMATOUS 03/26/2007  . Essential hypertension, benign 03/26/2007  . ALLERGIC RHINITIS 03/26/2007  . Osteopenia 03/26/2007  . BREAST CANCER, HX OF 03/26/2007    Past Surgical History:  Procedure  Laterality Date  . BREAST SURGERY     Radical RT Masctectomy; Tissue Removal LT  . COLONOSCOPY    . COLONOSCOPY WITH PROPOFOL N/A 12/01/2015   Procedure: COLONOSCOPY WITH PROPOFOL;  Surgeon: Manya Silvas, MD;  Location: [facility] ENDOSCOPY;  Service: Endoscopy;  Laterality: N/A;  . DILATION AND CURETTAGE OF UTERUS    . MASTECTOMY     radial rt.side/tissue removal on left  . MASTECTOMY Bilateral    radial R side; tissue removal L side  . POLYPECTOMY    . TUBAL LIGATION      Prior to Admission medications   Medication Sig Start Date End Date Taking? Authorizing Provider  donepezil (ARICEPT) 10 MG tablet Take 10 mg by mouth at bedtime.    [provider]  risperiDONE (RISPERDAL M-TAB) 0.5 MG disintegrating tablet Take 1 tablet (0.5 mg total) by mouth daily as needed. 06/05/20   Merlyn Lot, MD  risperiDONE (RISPERDAL M-TAB) 1 MG disintegrating tablet Take 1 tablet (1 mg total) by mouth at bedtime. 06/05/20   Merlyn Lot, MD    Allergies Sulfonamide derivatives  Family History  Problem Relation Age of Onset  . Osteoporosis Mother   . Osteoporosis Sister     Social History Social History   Tobacco Use  . Smoking status: Former Smoker    Quit date: 08/02/1996    Years since quitting: 24.0  .  Smokeless tobacco: Never Used  Substance Use Topics  . Alcohol use: Yes    Alcohol/week: 17.0 standard drinks    Types: 2 Glasses of wine, 15 Standard drinks or equivalent per week  . Drug use: No    Review of Systems  Unable to get a reliable review of systems as patient is not answering most of my questions. ____________________________________________   PHYSICAL EXAM:  VITAL SIGNS: ED Triage Vitals  Enc Vitals Group     BP 08/10/20 1451 (!) 97/57     Pulse Rate 08/10/20 1451 88     Resp 08/10/20 1451 18     Temp --      Temp src --      SpO2 08/10/20 1451 98 %     Weight 08/10/20 1452 138 lb 14.2 oz (63 kg)     Height 08/10/20 1452 5\' 2"  (1.575 m)      Head Circumference --      Peak Flow --      Pain Score 08/10/20 1452 0     Pain Loc --      Pain Edu? --      Excl. in [place]? --    Constitutional: Alert but not making sense.  She looks very tired.. Eyes: Conjunctivae are normal. . Head: Atraumatic. Nose: No congestion/rhinnorhea. Mouth/Throat: Mucous membranes are slightly dry.  Patient will not open her mouth for me Neck: No stridor.  Cardiovascular: Normal rate, regular rhythm. Grossly normal heart sounds.  Good peripheral circulation. Respiratory: Normal respiratory effort.  No retractions. Lungs CTAB. Gastrointestinal: Soft and nontender. No distention. No abdominal bruits.  Musculoskeletal: No lower extremity tenderness nor edema.   Neurologic:  Normal speech and language but she is not speaking very much.. No gross focal neurologic deficits are appreciated. . Skin:  Skin is warm, dry and intact. No rash noted.  ____________________________________________   LABS (all labs ordered are listed, but only abnormal results are displayed)  Labs Reviewed  COMPREHENSIVE METABOLIC PANEL - Abnormal; Notable for the following components:      Result Value   Sodium 151 (*)    Potassium 2.2 (*)    CO2 35 (*)    BUN 41 (*)    Calcium 8.4 (*)    Total Protein 6.3 (*)    Albumin 3.3 (*)    GFR, Estimated 59 (*)    All other components within normal limits  CBC - Abnormal; Notable for the following components:   WBC 10.9 (*)    HCT 47.0 (*)    MCV 101.7 (*)    All other components within normal limits  URINALYSIS, COMPLETE (UACMP) WITH MICROSCOPIC - Abnormal; Notable for the following components:   Color, Urine AMBER (*)    APPearance HAZY (*)    Ketones, ur 5 (*)    Protein, ur 30 (*)    All other components within normal limits  LACTIC ACID, PLASMA - Abnormal; Notable for the following components:   Lactic Acid, Venous 2.4 (*)    All other components within normal limits  CBC WITH DIFFERENTIAL/PLATELET - Abnormal; Notable  for the following components:   MCV 103.8 (*)    Monocytes Absolute 1.1 (*)    All other components within normal limits  TROPONIN I (HIGH SENSITIVITY) - Abnormal; Notable for the following components:   Troponin I (High Sensitivity) 25 (*)    All other components within normal limits  SARS CORONAVIRUS 2 (TAT 6-24 HRS)  MAGNESIUM  POC SARS CORONAVIRUS 2  AG -  ED   ____________________________________________  EKG   ____________________________________________  RADIOLOGY Gertha Calkin, personally viewed and evaluated these images (plain radiographs) as part of my medical decision making, as well as reviewing the written report by the radiologist.  ED MD interpretation: CT of the head read by radiology reviewed by me shows no acute changes chest x-ray read by radiology reviewed by me shows no acute abnormality either I reviewed both the films.  Official radiology report(s): CT Head Wo Contrast  Result Date: 08/10/2020 CLINICAL DATA:  Altered mental status. EXAM: CT HEAD WITHOUT CONTRAST TECHNIQUE: Contiguous axial images were obtained from the base of the skull through the vertex without intravenous contrast. COMPARISON:  None. FINDINGS: Brain: There is moderate severity cerebral atrophy with widening of the extra-axial spaces and ventricular dilatation. There are areas of decreased attenuation within the white matter tracts of the supratentorial brain, consistent with microvascular disease changes. An area of cortical encephalomalacia, with adjacent chronic white matter low attenuation is seen within the left occipital lobe. Vascular: No hyperdense vessel or unexpected calcification. Skull: Normal. Negative for fracture or focal lesion. Sinuses/Orbits: No acute finding. Other: None. IMPRESSION: 1. Generalized cerebral atrophy. 2. Chronic left occipital lobe infarct. 3. No acute intracranial abnormality. Electronically Signed   By: Virgina Norfolk M.D.   On: 08/10/2020 16:18   DG  Chest Port 1 View  Result Date: 08/10/2020 CLINICAL DATA:  Altered mental status. EXAM: PORTABLE CHEST 1 VIEW.  Patient markedly rotated. COMPARISON:  None. FINDINGS: The heart size and mediastinal contours are grossly unremarkable. No focal consolidation. No pulmonary edema. No pleural effusion. No pneumothorax. No acute osseous abnormality.  Breast implants noted. IMPRESSION: No acute cardiopulmonary abnormality; however, patient markedly rotated. Recommend repeat PA and lateral view of the chest. Electronically Signed   By: Iven Finn M.D.   On: 08/10/2020 16:12    ____________________________________________   PROCEDURES  Procedure(s) performed (including Critical Care): Critical care time 15 minutes.  This includes reviewing the patient's studies reviewing old records and contacting the hospitalist.  Procedures   ____________________________________________   INITIAL IMPRESSION / ASSESSMENT AND PLAN / ED COURSE  Patient has hypokalemia and some hyponatremia.  She has a slight UTI.  We will treat her with some lactated Ringer's IV and IV Rocephin and IV potassium as she is not drinking much.  I was worried that there might be metastatic disease from her breast cancer in her brain but there is nothing present there.  There may be something that turns up with hydration and chest but right now the lungs are clear.  She does have 2 breast implants.              ____________________________________________   FINAL CLINICAL IMPRESSION(S) / ED DIAGNOSES  Final diagnoses:  Altered mental status, unspecified altered mental status type  Hypokalemia  Hypernatremia  Urinary tract infection without hematuria, site unspecified     ED Discharge Orders    None      *Please note:  DAWNETTE MIONE was evaluated in Emergency Department on 08/10/2020 for the symptoms described in the history of present illness. She was evaluated in the context of the global COVID-19 pandemic, which  necessitated consideration that the patient might be at risk for infection with the SARS-CoV-2 virus that causes COVID-19. Institutional protocols and algorithms that pertain to the evaluation of patients at risk for COVID-19 are in a state of rapid change based on information released by regulatory bodies including  the [employer] and federal and state organizations. These policies and algorithms were followed during the patient's care in the ED.  Some ED evaluations and interventions may be delayed as a result of limited staffing during and the pandemic.*   Note:  This document was prepared using Dragon voice recognition software and may include unintentional dictation errors.    Nena Polio, MD 08/10/20 1730

## 2020-08-10 NOTE — ED Triage Notes (Signed)
Patient presents to ER from [facility] for altered mental status. Per staff patient has refused to eat, increasingly more weak for several days. Patient A&OX1 at baseline.

## 2020-08-10 NOTE — ED Notes (Signed)
In and out cath performed for urine specimen.

## 2020-08-10 NOTE — ED Notes (Signed)
Admitting provider at bedside.

## 2020-08-10 NOTE — ED Notes (Signed)
Staff at [facility] - called for update on patient status.

## 2020-08-10 NOTE — ED Notes (Signed)
Patient transported to X-ray 

## 2020-08-10 NOTE — ED Notes (Signed)
Dr. Cinda Quest updated patients family at bedside

## 2020-08-11 ENCOUNTER — Encounter: Payer: Self-pay | Admitting: Internal Medicine

## 2020-08-11 LAB — BASIC METABOLIC PANEL
Anion gap: 11 (ref 5–15)
Anion gap: 11 (ref 5–15)
BUN: 35 mg/dL — ABNORMAL HIGH (ref 8–23)
BUN: 37 mg/dL — ABNORMAL HIGH (ref 8–23)
CO2: 33 mmol/L — ABNORMAL HIGH (ref 22–32)
CO2: 34 mmol/L — ABNORMAL HIGH (ref 22–32)
Calcium: 7.6 mg/dL — ABNORMAL LOW (ref 8.9–10.3)
Calcium: 7.8 mg/dL — ABNORMAL LOW (ref 8.9–10.3)
Chloride: 107 mmol/L (ref 98–111)
Chloride: 107 mmol/L (ref 98–111)
Creatinine, Ser: 0.47 mg/dL (ref 0.44–1.00)
Creatinine, Ser: 0.51 mg/dL (ref 0.44–1.00)
GFR, Estimated: >60 mL/min (ref >60)
GFR, Estimated: >60 mL/min (ref >60)
Glucose, Bld: 100 mg/dL — ABNORMAL HIGH (ref 70–99)
Glucose, Bld: 100 mg/dL — ABNORMAL HIGH (ref 70–99)
Potassium: 2.2 mmol/L — CL (ref 3.5–5.1)
Potassium: 2.6 mmol/L — CL (ref 3.5–5.1)
Sodium: 151 mmol/L — ABNORMAL HIGH (ref 135–145)
Sodium: 152 mmol/L — ABNORMAL HIGH (ref 135–145)

## 2020-08-11 LAB — IRON AND TIBC
Iron: 79 ug/dL (ref 28–170)
Saturation Ratios: 68 % — ABNORMAL HIGH (ref 10.4–31.8)
TIBC: 116 ug/dL — ABNORMAL LOW (ref 250–450)
UIBC: 37 ug/dL

## 2020-08-11 LAB — CBC
HCT: 41.4 % (ref 36.0–46.0)
HCT: 43.5 % (ref 36.0–46.0)
Hemoglobin: 13.4 g/dL (ref 12.0–15.0)
Hemoglobin: 14.1 g/dL (ref 12.0–15.0)
MCH: 32.1 pg (ref 26.0–34.0)
MCH: 32.8 pg (ref 26.0–34.0)
MCHC: 32.4 g/dL (ref 30.0–36.0)
MCHC: 32.4 g/dL (ref 30.0–36.0)
MCV: 101.5 fL — ABNORMAL HIGH (ref 80.0–100.0)
MCV: 99.1 fL (ref 80.0–100.0)
Platelets: 162 10*3/uL (ref 150–400)
Platelets: 167 10*3/uL (ref 150–400)
RBC: 4.08 MIL/uL (ref 3.87–5.11)
RBC: 4.39 MIL/uL (ref 3.87–5.11)
RDW: 14 % (ref 11.5–15.5)
RDW: 14.4 % (ref 11.5–15.5)
WBC: 8.8 10*3/uL (ref 4.0–10.5)
WBC: 9.3 10*3/uL (ref 4.0–10.5)
nRBC: 0 % (ref 0.0–0.2)
nRBC: 0 % (ref 0.0–0.2)

## 2020-08-11 LAB — COMPREHENSIVE METABOLIC PANEL
ALT: 13 U/L (ref 0–44)
AST: 28 U/L (ref 15–41)
Albumin: 2.7 g/dL — ABNORMAL LOW (ref 3.5–5.0)
Alkaline Phosphatase: 68 U/L (ref 38–126)
Anion gap: 10 (ref 5–15)
BUN: 33 mg/dL — ABNORMAL HIGH (ref 8–23)
CO2: 32 mmol/L (ref 22–32)
Calcium: 7.9 mg/dL — ABNORMAL LOW (ref 8.9–10.3)
Chloride: 108 mmol/L (ref 98–111)
Creatinine, Ser: 0.52 mg/dL (ref 0.44–1.00)
GFR, Estimated: >60 mL/min (ref >60)
Glucose, Bld: 84 mg/dL (ref 70–99)
Potassium: 2.9 mmol/L — ABNORMAL LOW (ref 3.5–5.1)
Sodium: 150 mmol/L — ABNORMAL HIGH (ref 135–145)
Total Bilirubin: 0.9 mg/dL (ref 0.3–1.2)
Total Protein: 5.2 g/dL — ABNORMAL LOW (ref 6.5–8.1)

## 2020-08-11 LAB — MAGNESIUM: Magnesium: 2.3 mg/dL (ref 1.7–2.4)

## 2020-08-11 LAB — HEMOGLOBIN A1C
Hgb A1c MFr Bld: 5 % (ref 4.8–5.6)
Mean Plasma Glucose: 96.8 mg/dL

## 2020-08-11 LAB — PROCALCITONIN
Procalcitonin: <0.1 ng/mL
Procalcitonin: <0.1 ng/mL

## 2020-08-11 LAB — VITAMIN B12: Vitamin B-12: 654 pg/mL (ref 180–914)

## 2020-08-11 LAB — VALPROIC ACID LEVEL
Valproic Acid Lvl: 43 ug/mL — ABNORMAL LOW (ref 50.0–100.0)
Valproic Acid Lvl: 45 ug/mL — ABNORMAL LOW (ref 50.0–100.0)

## 2020-08-11 LAB — PHOSPHORUS: Phosphorus: 2.8 mg/dL (ref 2.5–4.6)

## 2020-08-11 LAB — C-REACTIVE PROTEIN: CRP: 7.6 mg/dL — ABNORMAL HIGH (ref <1.0)

## 2020-08-11 LAB — SARS CORONAVIRUS 2 (TAT 6-24 HRS): SARS Coronavirus 2: NEGATIVE

## 2020-08-11 LAB — FOLATE: Folate: 15.5 ng/mL (ref >5.9)

## 2020-08-11 LAB — TSH: TSH: 2.008 u[IU]/mL (ref 0.350–4.500)

## 2020-08-11 MED ORDER — POTASSIUM CHLORIDE CRYS ER 20 MEQ PO TBCR
40.0000 meq | EXTENDED_RELEASE_TABLET | ORAL | Status: DC
  Filled 2020-08-11: qty 2

## 2020-08-11 MED ORDER — AMLODIPINE BESYLATE 5 MG PO TABS
5.0000 mg | ORAL_TABLET | Freq: Every day | ORAL | Status: DC
  Administered 2020-08-11 – 2020-08-14 (×4): 5 mg via ORAL
  Filled 2020-08-11 (×4): qty 1

## 2020-08-11 MED ORDER — POTASSIUM CHLORIDE 10 MEQ/100ML IV SOLN
10.0000 meq | INTRAVENOUS | Status: AC
  Administered 2020-08-11 (×3): 10 meq via INTRAVENOUS
  Filled 2020-08-11: qty 100

## 2020-08-11 MED ORDER — POTASSIUM CHLORIDE 10 MEQ/100ML IV SOLN
10.0000 meq | INTRAVENOUS | Status: AC
  Administered 2020-08-11 (×2): 10 meq via INTRAVENOUS
  Filled 2020-08-11 (×2): qty 100

## 2020-08-11 MED ORDER — POTASSIUM CHLORIDE 10 MEQ/100ML IV SOLN
10.0000 meq | INTRAVENOUS | Status: AC
  Administered 2020-08-12: 10 meq via INTRAVENOUS
  Filled 2020-08-11: qty 100

## 2020-08-11 NOTE — Progress Notes (Signed)
[facility] Progress Note  Patient: Karen Ross    XBJ:[id number]  DOA: 08/10/2020     Date of Service: the patient was seen and examined on 08/11/2020  Chief Complaint  Patient presents with  . Altered Mental Status   Brief hospital course: Karen Ross is a 75 y.o. female with medical history significant of  Progressive dementia who resides at [facility] memory care weakness, refusal to take po with decrease po for several days.Please note patient is poor historian and history is taken from ED chart and husband. Per husband patient has increase agitation at NH and was said to have hit staff.  At that time per husband her valproic acid was increased. With this increase patient was noted to have increase falls. She was evaluated for her most recent falls one week ago with xray of her right hip  And knee which per husband was negative. Patient after falling and due to pain become more imoblie she was treated with tramadol for pain but this did not help. He valproic acid was titrated down however patient was noted to have increase intermittent agitation refusing to eat ,as well as getting agitated with personal care per husband.  Due to patient continued decrease intake, progressive weakness over the last several days and increase lethargy patient was transferred to ED for evaluation. Currently patient denies any pain but is not will to answer other questions at this time.  In ed patient was found to be relatively hypotensive,dehydrated with AKI, hypernatremia as well as hypokalemic. Patient is admitted for further treatment.   ED Course: Karen Ross, bp 97/57, rr18 sat 98%on ra Labs: Wbc: 10.9, hgb15,  mcv 101,plt 216 NA 151, K2.2, Cl103, bicarb 35, glu 91 cr 0.9 base /0.43 Mag 2.3 Lactic 2.4 CE 25 HY:QMVHQION  Respiratory panel pending EKG: nsr no hyperacute st -twave changes Cxr: IMPRESSION: No acute cardiopulmonary abnormality; however, patient markedly rotated. Recommend  repeat PA and lateral view of the chest. CT head: IMPRESSION: 1. Generalized cerebral atrophy. 2. Chronic left occipital lobe infarct. 3. No acute intracranial abnormality.    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 insetting of ? Medication side -effect -palliative care consult for assistance with goals of care  -hold sedating medications -hold HTN medications -valproic acid level 45 low   Severe Dehydration due to for po intake related to dementia /somnolence Leading to hypotension and AKI Continue IV fluid for hydration  Hypotension due to volume depletion, resolved s/p IVF - no sign of infection  - to be complete will f/u with urine culture / and inflammatory markers  - s/p ctx in ed , can re-dose in am based on clinical coarse /cultures -ivfs overnight  Started amlodipine as blood pressure is elevated now   AKI  -due to volume depletion from poor intake -hold nephrotoxic medications  - strict I/o   Hypernatremia most likely due to dehydration -ivfs overnight  -due to volume depletion   Hypokalemia -recheck labs  - replete prn    Body mass index is 25.4 kg/m.     Oral prognosis very poor, if patient does not improve and few days then probably she will be appropriate for hospice.  Family does not want PEG tube.   Diet: Dysphagia 1 honey thick liquids DVT Prophylaxis: Subcutaneous Lovenox   Advance goals of care discussion: DNR  Family Communication: family was present at bedside, at the time of interview.  The pt provided permission to discuss medical  plan with the family. Opportunity was given to ask question and all questions were answered satisfactorily.   Disposition:  Pt is from [facility] memory care unit, admitted with severe dementia decreased oral intake, dehydration, hypotension, still has decreased oral intake secondary to severe dementia, which precludes a safe discharge. Discharge to  memory care unit versus hospice care if no improvement in few days.  Subjective: No significant overnight issues, patient has severe dementia AAO x1, unable to offer any complaints. Patient's husband was at bedside, management plan discussed and he agreed with the palliative care and possibility of hospice if no improvement in few days.   Physical Exam: General:  alert not oriented to time, place, and person.  Appear in mild distress, affect flat in affect Eyes: PERRLA ENT: Oral Mucosa Clear, moist  Neck: no JVD,  Cardiovascular: S1 and S2 Present, no Murmur,  Respiratory: good respiratory effort, Bilateral Air entry equal and Decreased, no Crackles, no wheezes Abdomen: Bowel Sound present, Soft and no tenderness,  Skin: no rashes  Extremities: no Pedal edema, no calf tenderness Neurologic: without any new focal findings Gait not checked due to patient safety concerns  Vitals:   08/11/20 0430 08/11/20 0830 08/11/20 1230 08/11/20 1300  BP: (!) 146/92 (!) 155/77 (!) 145/75 (!) 147/87  Pulse: 72 69 60 73  Resp: (!) 22 13 17 18   Temp:  98.4 F (36.9 C)    TempSrc:  Axillary    SpO2: 90% 99%  100%  Weight:      Height:        Intake/Output Summary (Last 24 hours) at 08/11/2020 1621 Last data filed at 08/11/2020 1059 Gross per 24 hour  Intake 100 ml  Output --  Net 100 ml   Filed Weights   08/10/20 1452  Weight: 63 kg    Data Reviewed: I have personally reviewed and interpreted daily labs, tele strips, imagings as discussed above. I reviewed all nursing notes, pharmacy notes, vitals, pertinent old records I have discussed plan of care as described above with RN and patient/family.  CBC: Recent Labs  Lab 08/10/20 1503 08/10/20 1548 08/10/20 2338 08/11/20 0641  WBC 10.9* 10.3 8.8 9.3  NEUTROABS  --  7.5  --   --   HGB 15.0 13.8 13.4 14.1  HCT 47.0* 44.1 41.4 43.5  MCV 101.7* 103.8* 101.5* 99.1  PLT 216 197 162 [id number]   Basic Metabolic Panel: Recent Labs  Lab  08/10/20 1503 08/10/20 1548 08/10/20 2338 08/11/20 0215 08/11/20 0641  NA 151*  --  152* 151* 150*  K 2.2*  --  2.6* 2.2* 2.9*  CL 103  --  107 107 108  CO2 35*  --  34* 33* 32  GLUCOSE 91  --  100* 100* 84  BUN 41*  --  37* 35* 33*  CREATININE 0.99  --  0.51 0.47 0.52  CALCIUM 8.4*  --  7.8* 7.6* 7.9*  MG  --  2.3 2.3  --   --   PHOS  --   --  2.8  --   --     Studies: No results found.  Scheduled Meds: . amLODipine  5 mg Oral Daily  . atorvastatin  10 mg Oral q1800  . donepezil  10 mg Oral QHS  . heparin  5,000 Units Subcutaneous Q8H  . sertraline  100 mg Oral Daily  . sodium chloride flush  3 mL Intravenous Q12H   Continuous Infusions: . dextrose 5 % with KCl 20 mEq /  L 20 mEq (08/11/20 1408)  . potassium chloride Stopped (08/11/20 1551)   PRN Meds: acetaminophen, ondansetron (ZOFRAN) IV  Time spent: 35 minutes  Author: Val Riles. MD [facility] 08/11/2020 4:21 PM  To reach On-call, see care teams to locate the attending and reach out to them via [email or web address]. If 7PM-7AM, please contact night-coverage If you still have difficulty reaching the attending provider, please page the [facility] (Director on Call) for [facility] on amion for assistance.

## 2020-08-11 NOTE — ED Notes (Signed)
Resting in bed, husband at bedside. Call light in reach, no distress noted.

## 2020-08-11 NOTE — Progress Notes (Signed)
PT Cancellation Note  Patient Details Name: Karen Ross MRN: [id number] DOB: 02/15/1946   Cancelled Treatment:    Reason Eval/Treat Not Completed: Other (comment) (patient cannot cognitively participate in PT eval or follow direction at this time.) Evaluation attempted, patient's husband present. She is not able to follow direction, answer questions or tolerate assisted mobility at this time. (resisting). Will continue to monitor and re-attempt at later date.    Pressley Tadesse 08/11/2020, 1:34 PM

## 2020-08-11 NOTE — ED Notes (Signed)
Pt resting in bed, stirs with verbal communication. Unable to state her name, birthday, year or where she is. Mumbling without making clear sentences. Rise and fall of chest noted, denies pain, responded correctly when asked to straighten arm and bend legs. cooperative at this time, lungs clear, VSS.

## 2020-08-11 NOTE — ED Notes (Signed)
Very hard time with oral medication administration. Pills crushed and placed in applesauce. Unable to administer potassium at all. While pills dissolving, pt refused any more. Will address with MD.

## 2020-08-11 NOTE — Progress Notes (Signed)
Chart reviewed, bedside swallow eval completed with report to follow. Pt was eventually able to take crushed meds except for large potasium pills. Needed extended time and encouragement. Pt did better when she was allowed to assist taking the spoon to her mouth. Also did well with sips of thickened juice from the cup. Noted delayed cough x1. Will alter diet to Dys 1 with honey thick liquids. Very slow movements needed even while sitting Pt upright to limits Pt frustration and refusal. ST to follow and assist as needed. Discussed with husband. [place] and lips look much better since taking a few bites and sips. Pt will not allow oral care.

## 2020-08-11 NOTE — Evaluation (Signed)
Clinical/Bedside Swallow Evaluation Patient Details  Name: Karen Ross MRN: [id number] Date of Birth: 1945-08-17  Today's Date: 08/11/2020 Time: SLP Start Time (ACUTE ONLY): 1000 SLP Stop Time (ACUTE ONLY): 1110 SLP Time Calculation (min) (ACUTE ONLY): 70 min  Past Medical History:  Past Medical History:  Diagnosis Date  . Allergy   . Arthritis   . Breast cancer ([place])    in 1977  (Radical RT Mastectomy) and Tissue Removal LT Breast  . Cancer ([place])   . GERD (gastroesophageal reflux disease)   . History of breast cancer   . Hx of colonic polyps   . Hypertension   . Osteopenia   . Raynaud phenomenon    Past Surgical History:  Past Surgical History:  Procedure Laterality Date  . BREAST SURGERY     Radical RT Masctectomy; Tissue Removal LT  . COLONOSCOPY    . COLONOSCOPY WITH PROPOFOL N/A 12/01/2015   Procedure: COLONOSCOPY WITH PROPOFOL;  Surgeon: Manya Silvas, MD;  Location: [facility] ENDOSCOPY;  Service: Endoscopy;  Laterality: N/A;  . DILATION AND CURETTAGE OF UTERUS    . MASTECTOMY     radial rt.side/tissue removal on left  . MASTECTOMY Bilateral    radial R side; tissue removal L side  . POLYPECTOMY    . TUBAL LIGATION     HPI:  Per admitting H&P " weakness, refusal to take po with decrease po for several days  HPI: Karen Ross is a 75 y.o. female with medical history significant of   Progressive dementia who resides at [facility] memory care weakness, refusal to take po with decrease po for several days.Please note patient is poor historian and history is taken from ED chart and husband. Per husband patient has increase agitation at NH and was said to have hit staff.  At that time per husband her valproic acid was increased. With this increase patient was noted to have increase falls. She was evaluated for her most recent falls one week ago with xray of her right hip  And knee which per husband was negative. Patient after falling and due to pain become more imoblie she  was treated with tramadol for pain but this did not help. He valproic acid was titrated down however patient was noted to have increase intermittent agitation refusing to eat ,as well as getting agitated with personal care per husband.  Due to patient continued decrease intake, progressive weakness over the last several days and increase lethargy patient was transferred to ED for evaluation. Currently patient denies any pain but is not will to answer other questions at this time.  In ed patient was found to be relatively hypotensive,dehydrated with AKI, hypernatremia as well as hypokalemic.  Patient is admitted for further treatment."   Assessment / Plan / Recommendation Clinical Impression  Bedside swallow eval today revealed moderate to severe dysphagia mostly related to cognitive status/dementia. Pt became agitated very easily with any quick movements or attempts to provide oral care. Head of bed raised very slowly with conversation to distract her from the movement. Noted dried material on her lips which she was trying to remove but would not allow for help. After max cues Pt was able to take 6 bites of applesauce some with crushed meds/ Nsg assisting. Oral transit delay and oral residue but no s/s of aspiration. Pt was attempted to take her hands to her mouth therefore, cup of nectar thick liquids placed near her hands and giving hand to hand assist she did take several  sips. Noted delayed coughing after one of the sips. Rec Dys 1 diet with honey thick liquids for now. Will further attempt trials of thinner consistencies as a part of treatment. Pt was not able to take sips of liquid from a straw as she continually bit the straw. Attempts from the cup were also unsuccessful. Discussed with husband and Nsg. Po intake will be limited secondary to dementia. St to f/u and assist with education for safest Po diet and strategys to maximize nutritional intake. Meds crushed in applesauce, not able to take large  potassium pills. SLP Visit Diagnosis: Dysphagia, oropharyngeal phase (R13.12)    Aspiration Risk  Moderate aspiration risk;Risk for inadequate nutrition/hydration    Diet Recommendation Dysphagia 1 (Puree);Honey-thick liquid   Liquid Administration via: Cup Medication Administration: Crushed with puree Supervision: Staff to assist with self feeding;Full supervision/cueing for compensatory strategies Compensations: Minimize environmental distractions;Slow rate;Small sips/bites;Other (Comment) (Slow movements allow Pt to assist) Postural Changes: Seated upright at 90 degrees;Remain upright for at least 30 minutes after po intake    Other  Recommendations Oral Care Recommendations: Other (Comment) (Pt does not allow oral care secondary to dementia)   Follow up Recommendations Skilled Nursing facility      Frequency and Duration min 2x/week  1 week       Prognosis Prognosis for Safe Diet Advancement: Guarded Barriers to Reach Goals: Cognitive deficits;Behavior      Swallow Study   General Date of Onset: 08/10/20 HPI: Per admitting H&P " weakness, refusal to take po with decrease po for several days  HPI: Karen Ross is a 75 y.o. female with medical history significant of   Progressive dementia who resides at [facility] memory care weakness, refusal to take po with decrease po for several days.Please note patient is poor historian and history is taken from ED chart and husband. Per husband patient has increase agitation at NH and was said to have hit staff.  At that time per husband her valproic acid was increased. With this increase patient was noted to have increase falls. She was evaluated for her most recent falls one week ago with xray of her right hip  And knee which per husband was negative. Patient after falling and due to pain become more imoblie she was treated with tramadol for pain but this did not help. He valproic acid was titrated down however patient was noted to have  increase intermittent agitation refusing to eat ,as well as getting agitated with personal care per husband.  Due to patient continued decrease intake, progressive weakness over the last several days and increase lethargy patient was transferred to ED for evaluation. Currently patient denies any pain but is not will to answer other questions at this time.  In ed patient was found to be relatively hypotensive,dehydrated with AKI, hypernatremia as well as hypokalemic.  Patient is admitted for further treatment." Type of Study: Bedside Swallow Evaluation Diet Prior to this Study: Regular Respiratory Status: Room air History of Recent Intubation: No Behavior/Cognition: Confused;Agitated;Distractible;Requires cueing;Doesn't follow directions Oral Cavity Assessment: Other (comment);Dried secretions;Dry (Not able to fully assess) Oral Care Completed by SLP: Other (Comment) (Pt would not allow) Vision: Impaired for self-feeding Self-Feeding Abilities: Total assist Patient Positioning: Upright in bed;Other (comment) (Needed very slow movements of bed upright or she bacame agitated) Baseline Vocal Quality: Low vocal intensity Volitional Cough: Congested    Oral/Motor/Sensory Function     Ice Chips Ice chips: Within functional limits Presentation: Spoon   Thin Liquid Thin Liquid: Impaired (  Not able to take a sip from straw)    Nectar Thick Nectar Thick Liquid:  (Delayed cough x1) Presentation: Cup;Self Fed (with hand to hand assist)   Honey Thick     Puree Puree: Impaired Presentation: Spoon Oral Phase Impairments: Reduced lingual movement/coordination Oral Phase Functional Implications: Prolonged oral transit;Oral residue   Solid     Solid: Not tested      Lucila Maine 08/11/2020,11:23 AM

## 2020-08-12 ENCOUNTER — Other Ambulatory Visit: Payer: Self-pay

## 2020-08-12 DIAGNOSIS — R4182 Altered mental status, unspecified: Secondary | ICD-10-CM

## 2020-08-12 DIAGNOSIS — Z66 Do not resuscitate: Secondary | ICD-10-CM

## 2020-08-12 DIAGNOSIS — E876 Hypokalemia: Secondary | ICD-10-CM

## 2020-08-12 DIAGNOSIS — R627 Adult failure to thrive: Secondary | ICD-10-CM

## 2020-08-12 DIAGNOSIS — Z515 Encounter for palliative care: Secondary | ICD-10-CM

## 2020-08-12 DIAGNOSIS — Z7189 Other specified counseling: Secondary | ICD-10-CM

## 2020-08-12 LAB — PROCALCITONIN: Procalcitonin: <0.1 ng/mL

## 2020-08-12 LAB — BASIC METABOLIC PANEL
Anion gap: 12 (ref 5–15)
BUN: 20 mg/dL (ref 8–23)
CO2: 29 mmol/L (ref 22–32)
Calcium: 8.2 mg/dL — ABNORMAL LOW (ref 8.9–10.3)
Chloride: 104 mmol/L (ref 98–111)
Creatinine, Ser: 0.33 mg/dL — ABNORMAL LOW (ref 0.44–1.00)
GFR, Estimated: >60 mL/min (ref >60)
Glucose, Bld: 93 mg/dL (ref 70–99)
Potassium: 3.2 mmol/L — ABNORMAL LOW (ref 3.5–5.1)
Sodium: 145 mmol/L (ref 135–145)

## 2020-08-12 LAB — URINE CULTURE: Culture: NO GROWTH

## 2020-08-12 LAB — MAGNESIUM: Magnesium: 2.1 mg/dL (ref 1.7–2.4)

## 2020-08-12 LAB — CBC
HCT: 41.9 % (ref 36.0–46.0)
Hemoglobin: 13.6 g/dL (ref 12.0–15.0)
MCH: 32 pg (ref 26.0–34.0)
MCHC: 32.5 g/dL (ref 30.0–36.0)
MCV: 98.6 fL (ref 80.0–100.0)
Platelets: 163 10*3/uL (ref 150–400)
RBC: 4.25 MIL/uL (ref 3.87–5.11)
RDW: 14 % (ref 11.5–15.5)
WBC: 9 10*3/uL (ref 4.0–10.5)
nRBC: 0 % (ref 0.0–0.2)

## 2020-08-12 LAB — VITAMIN D 25 HYDROXY (VIT D DEFICIENCY, FRACTURES): Vit D, 25-Hydroxy: 18.2 ng/mL — ABNORMAL LOW (ref 30–100)

## 2020-08-12 LAB — GLUCOSE, CAPILLARY: Glucose-Capillary: 80 mg/dL (ref 70–99)

## 2020-08-12 LAB — PHOSPHORUS: Phosphorus: 2.2 mg/dL — ABNORMAL LOW (ref 2.5–4.6)

## 2020-08-12 MED ORDER — ADULT MULTIVITAMIN W/MINERALS CH
1.0000 | ORAL_TABLET | Freq: Every day | ORAL | Status: DC
  Administered 2020-08-13 – 2020-08-14 (×2): 1 via ORAL
  Filled 2020-08-12 (×2): qty 1

## 2020-08-12 MED ORDER — MELATONIN 3 MG PO TABS
6.0000 mg | ORAL_TABLET | Freq: Every evening | ORAL | Status: DC
  Administered 2020-08-12 – 2020-08-13 (×2): 6 mg via ORAL
  Filled 2020-08-12 (×6): qty 2

## 2020-08-12 MED ORDER — CLONIDINE HCL 0.2 MG/24HR TD PTWK
0.2000 mg | MEDICATED_PATCH | TRANSDERMAL | Status: DC
  Administered 2020-08-12: 0.2 mg via TRANSDERMAL
  Filled 2020-08-12: qty 1

## 2020-08-12 MED ORDER — POTASSIUM PHOSPHATES 15 MMOLE/5ML IV SOLN
20.0000 mmol | Freq: Once | INTRAVENOUS | Status: AC
  Administered 2020-08-12: 20 mmol via INTRAVENOUS
  Filled 2020-08-12: qty 6.67

## 2020-08-12 NOTE — Consult Note (Signed)
Consultation Note Date: 08/12/2020   Patient Name: Karen Ross  DOB: 07-Feb-1946  MRN: [id number]  Age / Sex: 75 y.o., female  PCP: [facility] Referring Physician: Val Riles, MD  Reason for Consultation: Establishing goals of care  HPI/Patient Profile: 75 y.o. female  with past medical history of dementia, hypertension, osteopenia, GERD, colon polyps, breast cancer admitted on 08/10/2020 with altered mental status, weakness and poor oral intake. Patient lives at [facility]. Husband reports increased agitation requiring increase in valproic acid which then led to increased falls. Xray of right hip and knee negative. Since falls, patient with worsening functional status, pain, agitation, and poor oral intake. Hospital admission for progressive dementia, hypotension, hypernatremia due to dehydration and poor oral intake, and hypokalemia. Workup negative for infection. Palliative medicine consultation for goals of care.     Clinical Assessment and Goals of Care:  I have reviewed medical records, discussed with care team, and met with husband in family conference room to discuss goals of care. Patient is awake but disoriented with poor oral intake.   I introduced Palliative Medicine as specialized medical care for people living with serious illness. It focuses on providing relief from the symptoms and stress of a serious illness. The goal is to improve quality of life for both the patient and the family.  We discussed a brief life review of the patient. This is the Gato's second marriage each. They each had one child from previous marriages and had a son together, who unfortunately passed away from metastatic cancer in August 2021. Mr. Stanbrough moved [name] into memory care in September 2021 as it was becoming more challenging for him to care for her at home.   Baseline prior to  admission, able to ambulate with walker but with recent falls. She requires assist with ADL's and frequent encouragement with meals.   Discussed course of hospitalization including diagnoses, interventions, plan of care. Discussed disease trajectory of progressive dementia. Mr. Conkle has a good understanding of her condition and poor long-term prognosis. He is surprised that her dementia has progressed so quickly. We discussed high risk for recurrent hospitalization due to progressive, end-stage dementia.   I attempted to elicit values and goals of care important to the husband. Advanced directives, concepts specific to code status, artifical feeding and hydration, and rehospitalization were considered and discussed. Husband confirms decisions for DNR/DNI and NO feeding tube placement, understanding these interventions would not provide a better quality of life as her condition progresses. Discussed comfort feeds, aspiration precautions.   The difference between aggressive medical intervention and comfort care was considered in light of the husband's wishes. Introduced and discussed outpatient palliative versus hospice options. Husband agrees that she would likely not benefit from physical therapy following this hospitalization. He is considering hospice options on discharge but would like to further discuss with family this weekend.   Discussed continuing medical management, watchful waiting.   Questions and concerns were addressed.  Hard Choices booklet and PMT contact information given for review. Reassured  of PMT provider f/u Monday morning if [name] remains hospitalized. Husband understands and agrees.    SUMMARY OF RECOMMENDATIONS    Husband confirms DNR/DNI, NO feeding tube.  Continue current plan of care, medical management through the weekend. Watchful waiting.   Husband considering hospice options on discharge but would like to further discuss with family. He understands patient's  irreversible condition, high risk for re-hospitalization, and poor long-term prognosis.   PMT provider off service at [facility] until Monday but will f/u 1/24 if patient remains hospitalized.   Code Status/Advance Care Planning:  DNR  Symptom Management:   Per attending  Palliative Prophylaxis:   Aspiration, Bowel Regimen, Delirium Protocol, Frequent Pain Assessment, Oral Care and Turn Reposition  Psycho-social/Spiritual:   Desire for further Chaplaincy support: yes  Additional Recommendations: Caregiving  Support/Resources, Compassionate Wean Education and Education on Hospice  Prognosis:   Poor long-term with progressive, end-stage dementia, adult failure to thrive, declining functional/cognitive and nutritional status with very poor oral intake. Hypernatremia and hypotension.  Discharge Planning: To Be Determined      Primary Diagnoses: Present on Admission: . Hypernatremia   I have reviewed the medical record, interviewed the patient and family, and examined the patient. The following aspects are pertinent.  Past Medical History:  Diagnosis Date  . Allergy   . Arthritis   . Breast cancer ([place])    in 1977  (Radical RT Mastectomy) and Tissue Removal LT Breast  . Cancer ([place])   . GERD (gastroesophageal reflux disease)   . History of breast cancer   . Hx of colonic polyps   . Hypertension   . Osteopenia   . Raynaud phenomenon    Social History   Socioeconomic History  . Marital status: Married    Spouse name: Not on file  . Number of children: 2  . Years of education: Not on file  . Highest education level: Not on file  Occupational History  . Occupation: Lexicographer    Comment: Retired  Tobacco Use  . Smoking status: Former Smoker    Quit date: 08/02/1996    Years since quitting: 24.0  . Smokeless tobacco: Never Used  Substance and Sexual Activity  . Alcohol use: Yes    Alcohol/week: 17.0 standard drinks    Types: 2 Glasses of wine, 15 Standard  drinks or equivalent per week  . Drug use: No  . Sexual activity: Not on file  Other Topics Concern  . Not on file  Social History Narrative   No living will   No health care POA--- husband to make decisions if needed---alternate son Elberta Fortis   Would accept resuscitation but no prolonged artificial ventilation   No feeding tube if cognitively unaware   Social Determinants of Health   Financial Resource Strain: Not on file  Food Insecurity: Not on file  Transportation Needs: Not on file  Physical Activity: Not on file  Stress: Not on file  Social Connections: Not on file   Family History  Problem Relation Age of Onset  . Osteoporosis Mother   . Osteoporosis Sister    Scheduled Meds: . amLODipine  5 mg Oral Daily  . atorvastatin  10 mg Oral q1800  . cloNIDine  0.2 mg Transdermal Weekly  . donepezil  10 mg Oral QHS  . heparin  5,000 Units Subcutaneous Q8H  . melatonin  6 mg Oral QPM  . [START ON 08/13/2020] multivitamin with minerals  1 tablet Oral Daily  . sertraline  100 mg Oral  Daily  . sodium chloride flush  3 mL Intravenous Q12H   Continuous Infusions: . dextrose 5 % with KCl 20 mEq / L 20 mEq (08/12/20 1949)   PRN Meds:.acetaminophen, ondansetron (ZOFRAN) IV Medications Prior to Admission:  Prior to Admission medications   Medication Sig Start Date End Date Taking? Authorizing Provider  acetaminophen (TYLENOL) 500 MG tablet Take 1,000 mg by mouth every 6 (six) hours as needed for mild pain or headache.   Yes [provider]  atorvastatin (LIPITOR) 10 MG tablet Take 10 mg by mouth daily.   Yes [provider]  divalproex (DEPAKOTE) 250 MG DR tablet Take 250 mg by mouth 3 (three) times daily.   Yes [provider]  donepezil (ARICEPT) 10 MG tablet Take 10 mg by mouth at bedtime.   Yes [provider]  lisinopril-hydrochlorothiazide (ZESTORETIC) 20-12.5 MG tablet Take 2 tablets by mouth daily.   Yes [provider]   risperiDONE (RISPERDAL M-TAB) 1 MG disintegrating tablet Take 1 tablet (1 mg total) by mouth at bedtime. 06/05/20  Yes Merlyn Lot, MD  sertraline (ZOLOFT) 100 MG tablet Take 100 mg by mouth daily.   Yes [provider]  traMADol (ULTRAM) 50 MG tablet Take 50 mg by mouth every 12 (twelve) hours as needed for moderate pain.   Yes [provider]  risperiDONE (RISPERDAL M-TAB) 0.5 MG disintegrating tablet Take 1 tablet (0.5 mg total) by mouth daily as needed. 06/05/20   Merlyn Lot, MD   Allergies  Allergen Reactions  . Sulfonamide Derivatives     REACTION: UNSPECIFIED; pt states it "didn't agree with me"   Review of Systems  Unable to perform ROS: Dementia   Physical Exam Vitals and nursing note reviewed.  Constitutional:      Appearance: She is cachectic. She is ill-appearing.  Cardiovascular:     Heart sounds: Normal heart sounds.  Pulmonary:     Effort: No tachypnea, accessory muscle usage or respiratory distress.  Neurological:     Mental Status: She is easily aroused.     Comments: Disoriented with baseline dementia, pleasant  Psychiatric:        Attention and Perception: She is inattentive.        Speech: Speech is delayed.        Cognition and Memory: Cognition is impaired.     Vital Signs: BP (!) 142/83 (BP Location: Right Leg)   Pulse (!) 101   Temp 97.6 F (36.4 C) (Axillary)   Resp 18   Ht $R'5\' 2"'Cy$  (1.575 m)   Wt 40.8 kg   SpO2 94%   BMI 16.45 kg/m  Pain Scale: 0-10   Pain Score: 0-No pain   SpO2: SpO2: 94 % O2 Device:SpO2: 94 % O2 Flow Rate: .   IO: Intake/output summary:   Intake/Output Summary (Last 24 hours) at 08/12/2020 2105 Last data filed at 08/12/2020 1825 Gross per 24 hour  Intake 1991.08 ml  Output -  Net 1991.08 ml    LBM: Last BM Date: 08/12/20 Baseline Weight: Weight: 63 kg Most recent weight: Weight: 40.8 kg     Palliative Assessment/Data: PPS 30%   Flowsheet Rows   Flowsheet Row Most Recent Value   Intake Tab   Referral Department Hospitalist  Unit at Time of Referral Cardiac/Telemetry Unit  Palliative Care Primary Diagnosis Neurology  Palliative Care Type New Palliative care  Reason for referral Clarify Goals of Care  Date first seen by Palliative Care 08/12/20  Clinical Assessment   Palliative  Performance Scale Score 30%  Psychosocial & Spiritual Assessment   Palliative Care Outcomes   Patient/Family meeting held? Yes  Who was at the meeting? husband  Palliative Care Outcomes Clarified goals of care, Counseled regarding hospice, Provided end of life care assistance, Provided psychosocial or spiritual support, ACP counseling assistance      Time Total: 75 Greater than 50%  of this time was spent counseling and coordinating care related to the above assessment and plan.  Signed by:  Ihor Dow, DNP, FNP-C Palliative Medicine Team  Phone: [phone] Fax: [phone]   Please contact Palliative Medicine Team phone at [phone] for questions and concerns.  For individual provider: See Shea Evans
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
## 2020-08-12 NOTE — Care Management Important Message (Signed)
Important Message  Patient Details  Name: Karen Ross MRN: [id number] Date of Birth: [birth year]   Medicare Important Message Given:  N/A - LOS <3 / Initial given by admissions  Initial IM reviewed with patient's spouse by Legrand Rams, Patient Access Associate on 08/11/2020 at [place] 08/12/2020, 8:25 AM

## 2020-08-12 NOTE — Plan of Care (Signed)
Continuing with plan of care. 

## 2020-08-12 NOTE — Progress Notes (Signed)
Patient is combative and upset, refused vital signs.  Dr. Dwyane Dee notified.

## 2020-08-12 NOTE — Progress Notes (Signed)
PT Cancellation Note  Patient Details Name: Karen Ross MRN: [id number] DOB: Jun 14, 1946   Cancelled Treatment:    Reason Eval/Treat Not Completed: Other (comment) Orders received, chart reviewed. Attempted to see pt for PT evaluation but nurse speaking with pt's family member at door of room & reports pt needs to sleep at this time, deferring PT. Will re-attempt as able & as pt is appropriate to participate.   Lavone Nian, PT, DPT 08/12/20, 2:39 PM    [name] 08/12/2020, 2:39 PM

## 2020-08-12 NOTE — TOC Initial Note (Signed)
Transition of Care [facility]) - Initial/Assessment Note    Patient Details  Name: Karen Ross MRN: [id number] Date of Birth: [birth year]  [facility] CM/SW Contact:    Candie Chroman, LCSW Phone Number: 08/12/2020, 12:46 PM  Clinical Narrative: Per RN in progression rounds, patient is from [facility] CSW called and spoke with Levada Dy, nurse at [facility]. Prior to hospitalization patient had become more lethargic. She stated patient was able to ambulate independently prior to admission. She did require assistance with ADL's. Patient was eating a regular diet (finger foods because she was "always on the go") and thin liquids. Patient was not getting home health or using DME at [facility]. Prior to admission they were going to discuss a palliative consult with her husband. Palliative consult pending here at the hospital. No further concerns. CSW will continue to follow patient for support and facilitate discharge once medically stable.                 Expected Discharge Plan: Memory Care Barriers to Discharge: Continued Medical Work up   Patient Goals and CMS Choice        Expected Discharge Plan and Services Expected Discharge Plan: Memory Care       Living arrangements for the past 2 months: [employer] (Memory Care)                                      Prior Living Arrangements/Services Living arrangements for the past 2 months: [place] (Memory Care) Lives with:: Facility Resident Patient language and need for interpreter reviewed:: Yes        Need for Family Participation in Patient Care: Yes (Comment) Care giver support system in place?: Yes (comment)   Criminal Activity/Legal Involvement Pertinent to Current Situation/Hospitalization: No - Comment as needed  Activities of Daily Living Home Assistive Devices/Equipment: Walker (specify type) ADL Screening (condition at time of admission) Patient's cognitive  ability adequate to safely complete daily activities?: No Is the patient deaf or have difficulty hearing?: No Does the patient have difficulty seeing, even when wearing glasses/contacts?: Yes Does the patient have difficulty concentrating, remembering, or making decisions?: Yes Patient able to express need for assistance with ADLs?: No Does the patient have difficulty dressing or bathing?: Yes Independently performs ADLs?: No Communication: Dependent Is this a change from baseline?: Pre-admission baseline Dressing (OT): Dependent Is this a change from baseline?: Pre-admission baseline Grooming: Dependent Is this a change from baseline?: Pre-admission baseline Feeding: Dependent Is this a change from baseline?: Pre-admission baseline Bathing: Dependent Is this a change from baseline?: Pre-admission baseline Toileting: Dependent Is this a change from baseline?: Pre-admission baseline In/Out Bed: Dependent Is this a change from baseline?: Pre-admission baseline Walks in Home: Dependent Is this a change from baseline?: Pre-admission baseline Does the patient have difficulty walking or climbing stairs?: Yes Weakness of Legs: Both Weakness of Arms/Hands: Both  Permission Sought/Granted Permission sought to share information with : Investment banker, corporate granted to share info w AGENCY: [facility]        Emotional Assessment Appearance:: Appears stated age Attitude/Demeanor/Rapport: Unable to Assess Affect (typically observed): Unable to Assess Orientation: :  (Disoriented x 4) Alcohol / Substance Use: Not Applicable Psych Involvement: No (comment)  Admission diagnosis:  Hypokalemia [E87.6] Hypernatremia [E87.0] Urinary tract infection without hematuria, site unspecified [N39.0] Altered  mental status, unspecified altered mental status type [R41.82] AMS (altered mental status) [R41.82] Patient Active Problem List   Diagnosis Date  Noted   Hypernatremia 08/10/2020   Dementia with aggressive behavior ([place]) 06/05/2020   Mild cognitive impairment with memory loss 12/25/2017   Dementia with Lewy bodies ([place]) 11/20/2017   Advanced directives, counseling/discussion 06/24/2014   Raynaud phenomenon    Routine general medical examination at a health care facility 05/25/2011   Generalized osteoarthrosis, involving multiple sites 06/11/2008   COLONIC POLYPS, ADENOMATOUS 03/26/2007   Essential hypertension, benign 03/26/2007   ALLERGIC RHINITIS 03/26/2007   Osteopenia 03/26/2007   BREAST CANCER, HX OF 03/26/2007   PCP:  [facility] Pharmacy:  No Pharmacies Listed    Social Determinants of Health (SDOH) Interventions    Readmission Risk Interventions No flowsheet data found.

## 2020-08-12 NOTE — Progress Notes (Addendum)
Initial Nutrition Assessment  DOCUMENTATION CODES:   Severe malnutrition in context of social or environmental circumstances  INTERVENTION:   Honey Thick Mighty Shake TID, each supplement provides 200kcal and 7g protein   Magic cup TID with meals, each supplement provides 290 kcal and 9 grams of protein  MVI daily   Pt at high refeed risk; recommend monitor potassium, magnesium and phosphorus labs daily until stable  NUTRITION DIAGNOSIS:   Severe Malnutrition related to social / environmental circumstances as evidenced by severe fat depletion,severe muscle depletion.  GOAL:   Patient will meet greater than or equal to 90% of their needs  MONITOR:   PO intake,Supplement acceptance,Labs,Weight trends,Skin,I & O's  REASON FOR ASSESSMENT:   Malnutrition Screening Tool    ASSESSMENT:   75 y/o female with medical history significant of progressive dementia who resides at [facility] memory care who is admitted with weakness and poor po  Met with pt at bedside. Pt unable to provide any nutrition related r/t dementia. Pt's breakfast and lunch trays were sitting on her side table untouched. Per chart, pt is down 22lbs(20%) since March; this is significant. RD suspects pt with poor appetite and oral intake at baseline. Pt seen by SLP and placed on a dysphagia 1/honey thick diet. RD will add supplements and MVI to help pt meet her estimated needs. Pt is at high refeed risk.   Medications reviewed and include: heparin, melatonin, 5% dextrose w/ KCl _0 /hr, KPhos  Labs reviewed: K 3.2(L), P 2.2(L), Mg 2.1 wnl  NUTRITION - FOCUSED PHYSICAL EXAM:  Flowsheet Row Most Recent Value  Orbital Region Severe depletion  Upper Arm Region Severe depletion  Thoracic and Lumbar Region Severe depletion  Buccal Region Severe depletion  Temple Region Severe depletion  Clavicle Bone Region Severe depletion  Clavicle and Acromion Bone Region Severe depletion  Scapular Bone Region Severe  depletion  Dorsal Hand Severe depletion  Patellar Region Severe depletion  Anterior Thigh Region Severe depletion  Posterior Calf Region Severe depletion  Edema (RD Assessment) None  Hair Reviewed  Eyes Reviewed  Mouth Reviewed  Skin Reviewed  Nails Reviewed     Diet Order:   Diet Order            DIET - DYS 1 Room service appropriate? Yes; Fluid consistency: Honey Thick  Diet effective now                EDUCATION NEEDS:   No education needs have been identified at this time  Skin:  Skin Assessment: Reviewed RN Assessment  Last BM:  1/21- type 5  Height:   Ht Readings from Last 1 Encounters:  08/11/20 _1  (1.575 m)    Weight:   Wt Readings from Last 1 Encounters:  08/12/20 40.8 kg    Ideal Body Weight:  50 kg  BMI:  Body mass index is 16.45 kg/m.  Estimated Nutritional Needs:   Kcal:  1200-1400kcal/day  Protein:  60-70g/day  Fluid:  1.1-1.3L/day  Koleen Distance MS, RD, LDN Please refer to [facility] for RD and/or RD on-call/weekend/after hours pager

## 2020-08-13 DIAGNOSIS — E43 Unspecified severe protein-calorie malnutrition: Secondary | ICD-10-CM | POA: Insufficient documentation

## 2020-08-13 LAB — BASIC METABOLIC PANEL
Anion gap: 10 (ref 5–15)
BUN: 8 mg/dL (ref 8–23)
CO2: 29 mmol/L (ref 22–32)
Calcium: 7.9 mg/dL — ABNORMAL LOW (ref 8.9–10.3)
Chloride: 101 mmol/L (ref 98–111)
Creatinine, Ser: <0.3 mg/dL — ABNORMAL LOW (ref 0.44–1.00)
Glucose, Bld: 125 mg/dL — ABNORMAL HIGH (ref 70–99)
Potassium: 3 mmol/L — ABNORMAL LOW (ref 3.5–5.1)
Sodium: 140 mmol/L (ref 135–145)

## 2020-08-13 LAB — PHOSPHORUS: Phosphorus: 2.5 mg/dL (ref 2.5–4.6)

## 2020-08-13 LAB — CBC
HCT: 37.4 % (ref 36.0–46.0)
Hemoglobin: 13 g/dL (ref 12.0–15.0)
MCH: 32.9 pg (ref 26.0–34.0)
MCHC: 34.8 g/dL (ref 30.0–36.0)
MCV: 94.7 fL (ref 80.0–100.0)
Platelets: 120 10*3/uL — ABNORMAL LOW (ref 150–400)
RBC: 3.95 MIL/uL (ref 3.87–5.11)
RDW: 13.9 % (ref 11.5–15.5)
WBC: 10.4 10*3/uL (ref 4.0–10.5)
nRBC: 0 % (ref 0.0–0.2)

## 2020-08-13 LAB — MAGNESIUM: Magnesium: 1.5 mg/dL — ABNORMAL LOW (ref 1.7–2.4)

## 2020-08-13 LAB — GLUCOSE, CAPILLARY: Glucose-Capillary: 126 mg/dL — ABNORMAL HIGH (ref 70–99)

## 2020-08-13 MED ORDER — MAGNESIUM SULFATE 2 GM/50ML IV SOLN
2.0000 g | INTRAVENOUS | Status: DC
  Filled 2020-08-13: qty 50

## 2020-08-13 MED ORDER — POTASSIUM CHLORIDE 10 MEQ/100ML IV SOLN
10.0000 meq | INTRAVENOUS | Status: AC
  Administered 2020-08-13 (×4): 10 meq via INTRAVENOUS
  Filled 2020-08-13 (×4): qty 100

## 2020-08-13 MED ORDER — MAGNESIUM SULFATE 4 GM/100ML IV SOLN
4.0000 g | Freq: Once | INTRAVENOUS | Status: AC
  Administered 2020-08-13: 4 g via INTRAVENOUS
  Filled 2020-08-13: qty 100

## 2020-08-13 MED ORDER — POTASSIUM PHOSPHATES 15 MMOLE/5ML IV SOLN
10.0000 mmol | Freq: Once | INTRAVENOUS | Status: AC
  Administered 2020-08-13: 10 mmol via INTRAVENOUS
  Filled 2020-08-13: qty 3.33

## 2020-08-13 NOTE — Progress Notes (Signed)
[facility] Progress Note  Patient: Karen Ross    PYP:[id number]  DOA: 08/10/2020     Date of Service: the patient was seen and examined on 08/13/2020  Chief Complaint  Patient presents with  . Altered Mental Status   Brief hospital course: Karen Ross is a 76 y.o. female with medical history significant of  Progressive dementia who resides at [facility] memory care weakness, refusal to take po with decrease po for several days.Please note patient is poor historian and history is taken from ED chart and husband. Per husband patient has increase agitation at NH and was said to have hit staff.  At that time per husband her valproic acid was increased. With this increase patient was noted to have increase falls. She was evaluated for her most recent falls one week ago with xray of her right hip  And knee which per husband was negative. Patient after falling and due to pain become more imoblie she was treated with tramadol for pain but this did not help. He valproic acid was titrated down however patient was noted to have increase intermittent agitation refusing to eat ,as well as getting agitated with personal care per husband.  Due to patient continued decrease intake, progressive weakness over the last several days and increase lethargy patient was transferred to ED for evaluation. Currently patient denies any pain but is not will to answer other questions at this time.  In ed patient was found to be relatively hypotensive,dehydrated with AKI, hypernatremia as well as hypokalemic. Patient is admitted for further treatment.   ED Course: Karen Ross, bp 97/57, rr18 sat 98%on ra Labs: Wbc: 10.9, hgb15,  mcv 101,plt 216 NA 151, K2.2, Cl103, bicarb 35, glu 91 cr 0.9 base /0.43 Mag 2.3, Lactic 2.4, CE 25, [id number]  Respiratory panel pending EKG: nsr no hyperacute st -twave changes Cxr:No acute cardiopulmonary abnormality; however, patient markedly rotated. Recommend repeat PA and  lateral view of the chest. CT head: Generalized cerebral atrophy. chronic left occipital lobe infarct. No acute intracranial abnormality.    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 -palliative care consult for assistance with goals of care  -hold sedating medications -valproic acid level 45 low   Severe Dehydration due to for po intake related to dementia  Leading to hypotension and AKI Continue IV fluid for hydration  Hypotension due to volume depletion, resolved s/p IVF - no sign of infection, UA negative, urine culture negative, COVID-negative   Hypertension, presented with low BP on admission but elevated BP now Started amlodipine as blood pressure is elevated now 1/21 started clonidine patch as patient may not take oral medications   AKI, resolved   -due to volume depletion from poor intake -hold nephrotoxic medications  - strict I/o   Hypernatremia most likely due to dehydration, resolved -Continue IV fluid for hydration -due to volume depletion   Hypokalemia -recheck labs  - replete prn   Hypomagnesemia, mag repleted.  Monitor and replete as needed  Hypophosphatemia, phos repleted.  Monitor and replete as needed.   Body mass index is 25.4 kg/m.     Oral prognosis very poor, palliative care consulted and discussed with patient's husband, they are leaning forward towards hospice option but would like to discuss with other family members.   In the meantime we will continue current treatment and IV fluids    Diet: Dysphagia 1 honey thick liquids DVT Prophylaxis: Subcutaneous Lovenox   Advance goals of care discussion:  DNR  Family Communication: family was present at bedside, at the time of interview.  The pt provided permission to discuss medical plan with the family. Opportunity was given to ask question and all questions were answered satisfactorily.  1/22 today nobody was available at  bedside  Disposition:  Pt is from [place] memory care unit, admitted with severe dementia decreased oral intake, dehydration, hypotension, still has decreased oral intake secondary to severe dementia, which precludes a safe discharge. Discharge to memory care unit vs hospice care if family will decide. Palliative care discussed with the family, they are leaning forward to hospice but husband will discuss with other family members.   Subjective: No significant overnight issues, patient has severe dementia AAO x zero, unable to offer any complaints. Patient seems to be resting comfortably without any acute distress. No family member was available at bedside In the meantime we will continue above treatment and will follow palliative care discussed with family for hospice on Monday  Physical Exam: General:  alert not oriented to time, place, and person.  Appear in mild distress, affect flat in affect Eyes: PERRLA ENT: Oral Mucosa Clear, moist  Neck: no JVD,  Cardiovascular: S1 and S2 Present, no Murmur,  Respiratory: good respiratory effort, Bilateral Air entry equal and Decreased, no Crackles, no wheezes Abdomen: Bowel Sound present, Soft and no tenderness,  Skin: no rashes  Extremities: no Pedal edema, no calf tenderness Neurologic: without any new focal findings Gait not checked due to patient safety concerns  Vitals:   08/13/20 0400 08/13/20 0437 08/13/20 0718 08/13/20 1057  BP:  (!) 156/83 (!) 156/100 (!) 130/92  Pulse:  81 89 82  Resp:  14 18 19   Temp:  97.6 F (36.4 C) 97.9 F (36.6 C) 98.5 F (36.9 C)  TempSrc:  Axillary Oral Oral  SpO2:  93% 93% 94%  Weight: 39.4 kg     Height:        Intake/Output Summary (Last 24 hours) at 08/13/2020 1410 Last data filed at 08/13/2020 1008 Gross per 24 hour  Intake 1881.08 ml  Output -  Net 1881.08 ml   Filed Weights   08/11/20 2113 08/12/20 0400 08/13/20 0400  Weight: 63 kg 40.8 kg 39.4 kg    Data Reviewed: I have  personally reviewed and interpreted daily labs, tele strips, imagings as discussed above. I reviewed all nursing notes, pharmacy notes, vitals, pertinent old records I have discussed plan of care as described above with RN and patient/family.  CBC: Recent Labs  Lab 08/10/20 1548 08/10/20 2338 08/11/20 0641 08/12/20 0404 08/13/20 0415  WBC 10.3 8.8 9.3 9.0 10.4  NEUTROABS 7.5  --   --   --   --   HGB 13.8 13.4 14.1 13.6 13.0  HCT 44.1 41.4 43.5 41.9 37.4  MCV 103.8* 101.5* 99.1 98.6 94.7  PLT 197 162 167 163 147*   Basic Metabolic Panel: Recent Labs  Lab 08/10/20 1548 08/10/20 2338 08/11/20 0215 08/11/20 0641 08/12/20 0404 08/13/20 0415  NA  --  152* 151* 150* 145 140  K  --  2.6* 2.2* 2.9* 3.2* 3.0*  CL  --  107 107 108 104 101  CO2  --  34* 33* 32 29 29  GLUCOSE  --  100* 100* 84 93 125*  BUN  --  37* 35* 33* 20 8  CREATININE  --  0.51 0.47 0.52 0.33* <0.30*  CALCIUM  --  7.8* 7.6* 7.9* 8.2* 7.9*  MG 2.3 2.3  --   --  2.1 1.5*  PHOS  --  2.8  --   --  2.2* 2.5    Studies: No results found.  Scheduled Meds: . amLODipine  5 mg Oral Daily  . atorvastatin  10 mg Oral q1800  . cloNIDine  0.2 mg Transdermal Weekly  . donepezil  10 mg Oral QHS  . heparin  5,000 Units Subcutaneous Q8H  . melatonin  6 mg Oral QPM  . multivitamin with minerals  1 tablet Oral Daily  . sertraline  100 mg Oral Daily  . sodium chloride flush  3 mL Intravenous Q12H   Continuous Infusions: . dextrose 5 % with KCl 20 mEq / L 20 mEq (08/12/20 1949)  . potassium chloride 10 mEq (08/13/20 1317)  . potassium PHOSPHATE IVPB (in mmol) 10 mmol (08/13/20 1315)   PRN Meds: acetaminophen, ondansetron (ZOFRAN) IV  Time spent: 35 minutes  Author: Val Riles. MD [facility] 08/13/2020 2:10 PM  To reach On-call, see care teams to locate the attending and reach out to them via [email or web address]. If 7PM-7AM, please contact night-coverage If you still have difficulty reaching the attending  provider, please page the [facility] (Director on Call) for [facility] on amion for assistance.

## 2020-08-13 NOTE — Progress Notes (Addendum)
PT Cancellation Note  Patient Details Name: Karen Ross MRN: [id number] DOB: 1945/12/20   Cancelled Treatment:    Reason Eval/Treat Not Completed: Other (comment) Orders received, chart reviewed. MD cleared pt for participation in PT after receiving K+ supplements. After lunch, nurse reports pt is on "last run" of K+ meds & cleared pt for PT attempt (Addendum: morning K+ value 3.0). Pt received in bed, able to verbalize first & last name with max cuing but unable to state birthday or age. Pt frequently looking up at ceiling vs at PT. Pt requires max cuing to track PT's finger and unable to follow command to give PT a "high five". Pt frequently closing eyes, drifting off to sleep then opening them with encouragement but stating "what?!". Pt left in bed with all needs met.  Per chart pt is from memory care unit at [facility] and has had a significant cognitive decline. At this time pt is not appropriate for acute PT intervention. PT will sign off. Please reconsult PT if appropriate.   Lavone Nian, PT, DPT 08/13/20, 1:51 PM     [name] 08/13/2020, 1:46 PM

## 2020-08-14 LAB — CBC
HCT: 34.6 % — ABNORMAL LOW (ref 36.0–46.0)
Hemoglobin: 12.2 g/dL (ref 12.0–15.0)
MCH: 33.2 pg (ref 26.0–34.0)
MCHC: 35.3 g/dL (ref 30.0–36.0)
MCV: 94.3 fL (ref 80.0–100.0)
Platelets: 108 10*3/uL — ABNORMAL LOW (ref 150–400)
RBC: 3.67 MIL/uL — ABNORMAL LOW (ref 3.87–5.11)
RDW: 14.1 % (ref 11.5–15.5)
WBC: 10.1 10*3/uL (ref 4.0–10.5)
nRBC: 0 % (ref 0.0–0.2)

## 2020-08-14 LAB — BASIC METABOLIC PANEL
Anion gap: 8 (ref 5–15)
BUN: <5 mg/dL — ABNORMAL LOW (ref 8–23)
CO2: 29 mmol/L (ref 22–32)
Calcium: 7.9 mg/dL — ABNORMAL LOW (ref 8.9–10.3)
Chloride: 102 mmol/L (ref 98–111)
Creatinine, Ser: <0.3 mg/dL — ABNORMAL LOW (ref 0.44–1.00)
Glucose, Bld: 120 mg/dL — ABNORMAL HIGH (ref 70–99)
Potassium: 3.7 mmol/L (ref 3.5–5.1)
Sodium: 139 mmol/L (ref 135–145)

## 2020-08-14 LAB — MAGNESIUM: Magnesium: 2.1 mg/dL (ref 1.7–2.4)

## 2020-08-14 LAB — GLUCOSE, CAPILLARY
Glucose-Capillary: 105 mg/dL — ABNORMAL HIGH (ref 70–99)
Glucose-Capillary: 109 mg/dL — ABNORMAL HIGH (ref 70–99)

## 2020-08-14 LAB — PHOSPHORUS: Phosphorus: 2.8 mg/dL (ref 2.5–4.6)

## 2020-08-14 MED ORDER — KCL IN DEXTROSE-NACL 20-5-0.9 MEQ/L-%-% IV SOLN
INTRAVENOUS | Status: DC
  Filled 2020-08-14 (×4): qty 1000

## 2020-08-14 MED ORDER — VITAMIN D (ERGOCALCIFEROL) 1.25 MG (50000 UNIT) PO CAPS
50000.0000 [IU] | ORAL_CAPSULE | ORAL | Status: DC
  Administered 2020-08-14: 50000 [IU] via ORAL
  Filled 2020-08-14: qty 1

## 2020-08-14 NOTE — Progress Notes (Signed)
[facility] Progress Note  Patient: Karen Ross    [id number]  DOA: 08/10/2020     Date of Service: the patient was seen and examined on 08/14/2020  Chief Complaint  Patient presents with  . Altered Mental Status   Brief hospital course: Karen Ross is a 75 y.o. female with medical history significant of  Progressive dementia who resides at [facility] memory care weakness, refusal to take po with decrease po for several days.Please note patient is poor historian and history is taken from ED chart and husband. Per husband patient has increase agitation at NH and was said to have hit staff.  At that time per husband her valproic acid was increased. With this increase patient was noted to have increase falls. She was evaluated for her most recent falls one week ago with xray of her right hip  And knee which per husband was negative. Patient after falling and due to pain become more imoblie she was treated with tramadol for pain but this did not help. He valproic acid was titrated down however patient was noted to have increase intermittent agitation refusing to eat ,as well as getting agitated with personal care per husband.  Due to patient continued decrease intake, progressive weakness over the last several days and increase lethargy patient was transferred to ED for evaluation. Currently patient denies any pain but is not will to answer other questions at this time.  In ed patient was found to be relatively hypotensive,dehydrated with AKI, hypernatremia as well as hypokalemic. Patient is admitted for further treatment.   ED Course: Karen Ross, bp 97/57, rr18 sat 98%on ra Labs: Wbc: 10.9, hgb15,  mcv 101,plt 216 NA 151, K2.2, Cl103, bicarb 35, glu 91 cr 0.9 base /0.43 Mag 2.3, Lactic 2.4, CE 25, [id number]  Respiratory panel pending EKG: nsr no hyperacute st -twave changes Cxr:No acute cardiopulmonary abnormality; however, patient markedly rotated. Recommend repeat PA and  lateral view of the chest. CT head: Generalized cerebral atrophy. chronic left occipital lobe infarct. No acute intracranial abnormality.    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 -palliative care consult for assistance with goals of care  -hold sedating medications -valproic acid level 45 low   Severe Dehydration due to for po intake related to dementia  Leading to hypotension and AKI Continue IV fluid for hydration  Hypotension due to volume depletion, resolved s/p IVF - no sign of infection, UA negative, urine culture negative, COVID-negative   Hypertension, presented with low BP on admission but elevated BP now Started amlodipine as blood pressure is elevated now 1/21 started clonidine patch as patient may not take oral medications   AKI, resolved   -due to volume depletion from poor intake -hold nephrotoxic medications  - strict I/o   Hypernatremia most likely due to dehydration, resolved -Continue IV fluid for hydration -due to volume depletion   Hypokalemia -recheck labs  - replete prn   Hypomagnesemia, mag repleted.  Monitor and replete as needed  Hypophosphatemia, phos repleted.  Monitor and replete as needed.   Vitamin D deficiency, started vitamin D 50,000 units p.o. weekly for total 8 weeks.  Body mass index is 25.4 kg/m.     Overall prognosis is very poor, palliative care consulted and discussed with patient's husband, they are leaning forward towards hospice option but would like to discuss with other family members.   In the meantime we will continue current treatment and IV fluids    Diet:  Dysphagia 1 honey thick liquids DVT Prophylaxis: Subcutaneous Lovenox   Advance goals of care discussion: DNR  Family Communication: family was present at bedside, at the time of interview.  The pt provided permission to discuss medical plan with the family. Opportunity was given to ask question  and all questions were answered satisfactorily.  1/22 today nobody was available at bedside  Disposition:  Pt is from [place] memory care unit, admitted with severe dementia decreased oral intake, dehydration, hypotension, still has decreased oral intake secondary to severe dementia, which precludes a safe discharge. Discharge to memory care unit vs hospice care if family will decide. Palliative care discussed with the family, they are leaning forward to hospice but husband will discuss with other family members.   Subjective: No significant overnight issues, patient has severe dementia AAO x zero, unable to offer any complaints. Patient seems to be resting comfortably without any acute distress. No family member was available at bedside In the meantime we will continue above treatment and will follow palliative care discussed with family for hospice on Monday  Physical Exam: General:  alert not oriented to time, place, and person.  Appear in mild distress, affect flat in affect Eyes: PERRLA ENT: Oral Mucosa Clear, moist  Neck: no JVD,  Cardiovascular: S1 and S2 Present, no Murmur,  Respiratory: good respiratory effort, Bilateral Air entry equal and Decreased, no Crackles, no wheezes Abdomen: Bowel Sound present, Soft and no tenderness,  Skin: no rashes  Extremities: no Pedal edema, no calf tenderness Neurologic: without any new focal findings Gait not checked due to patient safety concerns  Vitals:   08/14/20 0500 08/14/20 0604 08/14/20 0732 08/14/20 0855  BP:  (!) 145/90 (!) 140/95 (!) 157/86  Pulse:  80 82 85  Resp:  16 20 20   Temp:  97.7 F (36.5 C) 98.4 F (36.9 C) 98.2 F (36.8 C)  TempSrc:  Oral Oral Oral  SpO2:  93% 93% 94%  Weight: 40.1 kg     Height:        Intake/Output Summary (Last 24 hours) at 08/14/2020 1204 Last data filed at 08/13/2020 2228 Gross per 24 hour  Intake 1673.77 ml  Output --  Net 1673.77 ml   Filed Weights   08/12/20 0400 08/13/20 0400  08/14/20 0500  Weight: 40.8 kg 39.4 kg 40.1 kg    Data Reviewed: I have personally reviewed and interpreted daily labs, tele strips, imagings as discussed above. I reviewed all nursing notes, pharmacy notes, vitals, pertinent old records I have discussed plan of care as described above with RN and patient/family.  CBC: Recent Labs  Lab 08/10/20 1548 08/10/20 2338 08/11/20 0641 08/12/20 0404 08/13/20 0415 08/14/20 0433  WBC 10.3 8.8 9.3 9.0 10.4 10.1  NEUTROABS 7.5  --   --   --   --   --   HGB 13.8 13.4 14.1 13.6 13.0 12.2  HCT 44.1 41.4 43.5 41.9 37.4 34.6*  MCV 103.8* 101.5* 99.1 98.6 94.7 94.3  PLT 197 162 167 163 120* 341*   Basic Metabolic Panel: Recent Labs  Lab 08/10/20 1548 08/10/20 2338 08/11/20 0215 08/11/20 0641 08/12/20 0404 08/13/20 0415 08/14/20 0433  NA  --  152* 151* 150* 145 140 139  K  --  2.6* 2.2* 2.9* 3.2* 3.0* 3.7  CL  --  107 107 108 104 101 102  CO2  --  34* 33* 32 29 29 29   GLUCOSE  --  100* 100* 84 93 125* 120*  BUN  --  37* 35* 33* 20 8 <5*  CREATININE  --  0.51 0.47 0.52 0.33* <0.30* <0.30*  CALCIUM  --  7.8* 7.6* 7.9* 8.2* 7.9* 7.9*  MG 2.3 2.3  --   --  2.1 1.5* 2.1  PHOS  --  2.8  --   --  2.2* 2.5 2.8    Studies: No results found.  Scheduled Meds: . amLODipine  5 mg Oral Daily  . atorvastatin  10 mg Oral q1800  . cloNIDine  0.2 mg Transdermal Weekly  . donepezil  10 mg Oral QHS  . heparin  5,000 Units Subcutaneous Q8H  . melatonin  6 mg Oral QPM  . multivitamin with minerals  1 tablet Oral Daily  . sertraline  100 mg Oral Daily  . sodium chloride flush  3 mL Intravenous Q12H  . Vitamin D (Ergocalciferol)  50,000 Units Oral Q7 days   Continuous Infusions: . dextrose 5 % and 0.9 % NaCl with KCl 20 mEq/L 75 mL/hr at 08/14/20 0858   PRN Meds: acetaminophen, ondansetron (ZOFRAN) IV  Time spent: 35 minutes  Author: Val Riles. MD [facility] 08/14/2020 12:04 PM  To reach On-call, see care teams to locate the  attending and reach out to them via [email or web address]. If 7PM-7AM, please contact night-coverage If you still have difficulty reaching the attending provider, please page the [facility] (Director on Call) for [facility] on amion for assistance.

## 2020-08-14 NOTE — Progress Notes (Signed)
Speech Language Pathology Treatment: Dysphagia  Patient Details Name: Karen Ross MRN: [id number] DOB: [birth year] Today's Date: 08/14/2020 Time: 9485-4627 SLP Time Calculation (min) (ACUTE ONLY): 35 min  Assessment / Plan / Recommendation Clinical Impression  Pt appears to present w/ oropharyngeal phase dysphagia in light of Significantly declined Cognitive status; advanced Dementia w/ agitation/confusion. This impacts her overall awareness/engagement and safety during po tasks which increases risk for aspiration, choking. Pt's risk for aspiration, as well as concern of meeting nutritional needs, is present. She requires Max tactile/verbal/ visual cues for orientation to bolus presentation, and during feeding support. Pt consumed only few trials of Honey liquids via TSP; No immediate, overt clinical s/s of aspiration noted; no decline in vocal quality during few verbalizations(mumbled), no cough, and no decline in respiratory status during/post trials. Oral phase was cb/ min slow A-P transfer but given Time she appeared to clear; min chewing behavior noted. Assisted NSG in  attempting Crushed pills in Honey liquids during this session w/ SLP -- the liquid appeared to transfer posteriorly more successful than Puree which NSG stated she was Orally Holding w/ them. Pt appeared confused often biting on oral swab stick or refusing during oral care despite verbal/visual support.   D/t pt's declined Cognitive status, her risk for aspiration, recommend continue the dysphagia level 1(puree) w/ Honey liquids via TSP; aspiration precautions; reduce Distractions during meals and only give po's when pt is calm and participative. Pills Crushed in Puree for safer swallowing. Support w/ feeding at meals -- check for oral clearing during/post intake. NSG updated. ST services w/ follow w/ Palliative Care's plan w/ family for pt's GOC and d/c plan; Hospice is being considered per chart notes. ST services can be  available for further education as warranted but would recommend this diet for D/C. NSG agreed. Largely suspect that pt's advanced Dementia and behaviors could continue to hamper oral intake and upgrade of diet. Precautions posted in room.      HPI HPI: Per admitting H&P " weakness, refusal to take po with decrease po for several days  HPI: Karen Ross is a 75 y.o. female with medical history significant of   Progressive dementia who resides at [facility] memory care weakness, refusal to take po with decrease po for several days.Please note patient is poor historian and history is taken from ED chart and husband. Per husband patient has increase agitation at NH and was said to have hit staff.  At that time per husband her valproic acid was increased. With this increase patient was noted to have increase falls. She was evaluated for her most recent falls one week ago with xray of her right hip  And knee which per husband was negative. Patient after falling and due to pain become more imoblie she was treated with tramadol for pain but this did not help. He valproic acid was titrated down however patient was noted to have increase intermittent agitation refusing to eat ,as well as getting agitated with personal care per husband.  Due to patient continued decrease intake, progressive weakness over the last several days and increase lethargy patient was transferred to ED for evaluation. Currently patient denies any pain but is not will to answer other questions at this time.  In ed patient was found to be relatively hypotensive,dehydrated with AKI, hypernatremia as well as hypokalemic.  Patient is admitted for further treatment."      SLP Plan  Continue with current plan of care  Recommendations  Diet recommendations: Dysphagia 1 (puree);Honey-thick liquid Liquids provided via: Teaspoon Medication Administration: Crushed with puree Supervision: Full supervision/cueing for compensatory  strategies;Staff to assist with self feeding Compensations: Minimize environmental distractions;Slow rate;Small sips/bites;Multiple dry swallows after each bite/sip;Lingual sweep for clearance of pocketing;Follow solids with liquid Postural Changes and/or Swallow Maneuvers: Seated upright 90 degrees;Upright 30-60 min after meal                General recommendations:  (Palliative Care f/u) Oral Care Recommendations: Oral care QID;Oral care before and after PO;Staff/trained caregiver to provide oral care Follow up Recommendations:  (TBD) SLP Visit Diagnosis: Dysphagia, oropharyngeal phase (R13.12) Plan: Continue with current plan of care       GO                 Orinda Kenner, [place], [place] Pathologist Rehab Services [phone] [facility] 08/14/2020, 11:57 AM

## 2020-08-15 DIAGNOSIS — E43 Unspecified severe protein-calorie malnutrition: Secondary | ICD-10-CM

## 2020-08-15 DIAGNOSIS — F0281 Dementia in other diseases classified elsewhere with behavioral disturbance: Secondary | ICD-10-CM

## 2020-08-15 DIAGNOSIS — G309 Alzheimer's disease, unspecified: Secondary | ICD-10-CM

## 2020-08-15 LAB — CBC
HCT: 33.5 % — ABNORMAL LOW (ref 36.0–46.0)
Hemoglobin: 11.4 g/dL — ABNORMAL LOW (ref 12.0–15.0)
MCH: 32.9 pg (ref 26.0–34.0)
MCHC: 34 g/dL (ref 30.0–36.0)
MCV: 96.5 fL (ref 80.0–100.0)
Platelets: 122 10*3/uL — ABNORMAL LOW (ref 150–400)
RBC: 3.47 MIL/uL — ABNORMAL LOW (ref 3.87–5.11)
RDW: 14 % (ref 11.5–15.5)
WBC: 10.6 10*3/uL — ABNORMAL HIGH (ref 4.0–10.5)
nRBC: 0 % (ref 0.0–0.2)

## 2020-08-15 LAB — BASIC METABOLIC PANEL
Anion gap: 8 (ref 5–15)
BUN: <5 mg/dL — ABNORMAL LOW (ref 8–23)
CO2: 25 mmol/L (ref 22–32)
Calcium: 8.1 mg/dL — ABNORMAL LOW (ref 8.9–10.3)
Chloride: 107 mmol/L (ref 98–111)
Creatinine, Ser: <0.3 mg/dL — ABNORMAL LOW (ref 0.44–1.00)
Glucose, Bld: 107 mg/dL — ABNORMAL HIGH (ref 70–99)
Potassium: 4.2 mmol/L (ref 3.5–5.1)
Sodium: 140 mmol/L (ref 135–145)

## 2020-08-15 LAB — GLUCOSE, CAPILLARY: Glucose-Capillary: 109 mg/dL — ABNORMAL HIGH (ref 70–99)

## 2020-08-15 LAB — MAGNESIUM: Magnesium: 1.7 mg/dL (ref 1.7–2.4)

## 2020-08-15 LAB — PHOSPHORUS: Phosphorus: 2.6 mg/dL (ref 2.5–4.6)

## 2020-08-15 MED ORDER — MORPHINE SULFATE (CONCENTRATE) 10 MG/0.5ML PO SOLN: 5.0000 mg | ORAL | Status: DC | PRN

## 2020-08-15 MED ORDER — HALOPERIDOL LACTATE 5 MG/ML IJ SOLN
2.0000 mg | Freq: Four times a day (QID) | INTRAMUSCULAR | Status: DC | PRN
  Administered 2020-08-15 – 2020-08-16 (×3): 2 mg via INTRAVENOUS
  Filled 2020-08-15 (×3): qty 1

## 2020-08-15 MED ORDER — LORAZEPAM 0.5 MG PO TABS: 0.5000 mg | ORAL_TABLET | ORAL | Status: DC | PRN

## 2020-08-15 MED ORDER — MORPHINE SULFATE (PF) 2 MG/ML IV SOLN: 1.0000 mg | INTRAVENOUS | Status: DC | PRN

## 2020-08-15 MED ORDER — LORAZEPAM 2 MG/ML IJ SOLN: 0.5000 mg | INTRAMUSCULAR | Status: DC | PRN

## 2020-08-15 MED ORDER — ATROPINE SULFATE 1 % OP SOLN
2.0000 [drp] | Freq: Four times a day (QID) | OPHTHALMIC | Status: DC | PRN
  Filled 2020-08-15: qty 2

## 2020-08-15 NOTE — Care Management Important Message (Signed)
Important Message  Patient Details  Name: Karen Ross MRN: [id number] Date of Birth: 1946/07/14   Medicare Important Message Given:  Yes     Dannette Barbara 08/15/2020, 12:16 PM

## 2020-08-15 NOTE — TOC Progression Note (Signed)
Transition of Care [facility]) - Progression Note    Patient Details  Name: Karen Ross MRN: [id number] Date of Birth: 1945/09/08  Transition of Care [facility]) CM/SW [place], LCSW Phone Number: 08/15/2020, 10:39 AM  Clinical Narrative:  Received consult for hospice house placement. CSW called husband and confirmed preference for [facility] facility. Made referral to Zandra Abts, RN with [facility].   Expected Discharge Plan: Memory Care Barriers to Discharge: Continued Medical Work up  Expected Discharge Plan and Services Expected Discharge Plan: Memory Care       Living arrangements for the past 2 months: [facility] (Memory Care)                                       Social Determinants of Health (SDOH) Interventions    Readmission Risk Interventions No flowsheet data found.

## 2020-08-15 NOTE — Progress Notes (Signed)
Daily Progress Note   Patient Name: Karen Ross       Date: 08/15/2020 DOB: 1946/03/27  Age: 75 y.o. MRN#: [id number] Attending Physician: Val Riles, MD Primary Care Physician: Orvis Brill, [facility] Admit Date: 08/10/2020  Reason for Consultation/Follow-up: Establishing goals of care  Subjective: Patient wakes to voice but disoriented with baseline dementia. She becomes agitated when bothered. She is refusing bites and sips and unsafe to give medications.   GOC:  Spoke with husband, Jyla Hopf this AM. Provided update on her condition, plan of care, and unfortunately no progress through the weekend despite medical management. Discussed dementia disease trajectory. Educated on recommendation for hospice facility and comfort focused care plan. Simona Huh understands and agrees with shift to comfort focused care. He does prefer she discharge to hospice facility when bed available. Husband requests I meet with him, sister-in-law, and daughter-in-law this afternoon to answer questions.   1615: Met with husband, SIL, DIL in family conference room. Discussed events leading up to admission, course of hospitalization including diagnoses, interventions, plan of care, dementia disease trajectory, and poor prognosis. Confirmed husbands decision against aggressive measures such as CPR, life support, or feeding tube. DIL and SIL understand and agree. They do not wish to see her suffer. They agree with transition to comfort focused care and hospice facility placement. Discussed symptom management medications. Discussed comfort feeds/aspiration precautions. Answered questions. PMT contact information given.   Length of Stay: 5  Current Medications: Scheduled Meds:  . cloNIDine  0.2 mg Transdermal  Weekly  . melatonin  6 mg Oral QPM  . sodium chloride flush  3 mL Intravenous Q12H    Continuous Infusions:   PRN Meds: acetaminophen, atropine, haloperidol lactate, LORazepam, LORazepam, morphine injection, morphine CONCENTRATE, ondansetron (ZOFRAN) IV  Physical Exam Vitals and nursing note reviewed.  Constitutional:      Appearance: She is cachectic. She is ill-appearing.  HENT:     Head: Normocephalic and atraumatic.  Cardiovascular:     Heart sounds: Normal heart sounds.  Pulmonary:     Effort: No tachypnea, accessory muscle usage or respiratory distress.  Skin:    General: Skin is warm and dry.  Neurological:     Mental Status: She is easily aroused.     Comments: Oriented to name otherwise disoriented with baseline dementia.  Psychiatric:  Attention and Perception: She is inattentive.        Speech: Speech is delayed.        Cognition and Memory: Cognition is impaired.             Vital Signs: BP (!) 149/77   Pulse 84   Temp 98 F (36.7 C) (Oral)   Resp 20   Ht $R'5\' 2"'Lo$  (1.575 m)   Wt 40.1 kg   SpO2 92%   BMI 16.17 kg/m  SpO2: SpO2: 92 % O2 Device: O2 Device: Room Air O2 Flow Rate:    Intake/output summary:  No intake or output data in the 24 hours ending 08/15/20 1700 LBM: Last BM Date: 08/15/20 Baseline Weight: Weight: 63 kg Most recent weight: Weight: 40.1 kg       Palliative Assessment/Data: PPS 20%    Flowsheet Rows   Flowsheet Row Most Recent Value  Intake Tab   Referral Department Hospitalist  Unit at Time of Referral Cardiac/Telemetry Unit  Palliative Care Primary Diagnosis Neurology  Palliative Care Type New Palliative care  Reason for referral Clarify Goals of Care  Date first seen by Palliative Care 08/12/20  Clinical Assessment   Palliative Performance Scale Score 20%  Psychosocial & Spiritual Assessment   Palliative Care Outcomes   Patient/Family meeting held? Yes  Who was at the meeting? husband, DIL, SIL  Palliative Care  Outcomes Counseled regarding hospice, Provided end of life care assistance, Provided psychosocial or spiritual support, Transitioned to hospice, ACP counseling assistance, Changed to focus on comfort, Improved pain interventions, Improved non-pain symptom therapy      Patient Active Problem List   Diagnosis Date Noted  . Protein-calorie malnutrition, severe 08/13/2020  . Altered mental status   . Hypokalemia   . Adult failure to thrive   . Palliative care by specialist   . DNR (do not resuscitate)   . Hypernatremia 08/10/2020  . Dementia with behavioral disturbance ([place]) 06/05/2020  . Mild cognitive impairment with memory loss 12/25/2017  . Dementia with Lewy bodies ([place]) 11/20/2017  . Goals of care, counseling/discussion 06/24/2014  . Raynaud phenomenon   . Routine general medical examination at a health care facility 05/25/2011  . Generalized osteoarthrosis, involving multiple sites 06/11/2008  . COLONIC POLYPS, ADENOMATOUS 03/26/2007  . Essential hypertension, benign 03/26/2007  . ALLERGIC RHINITIS 03/26/2007  . Osteopenia 03/26/2007  . BREAST CANCER, HX OF 03/26/2007    Palliative Care Assessment & Plan   Patient Profile: 75 y.o. female  with past medical history of dementia, hypertension, osteopenia, GERD, colon polyps, breast cancer admitted on 08/10/2020 with altered mental status, weakness and poor oral intake. Patient lives at [facility]. Husband reports increased agitation requiring increase in valproic acid which then led to increased falls. Xray of right hip and knee negative. Since falls, patient with worsening functional status, pain, agitation, and poor oral intake. Hospital admission for progressive dementia, hypotension, hypernatremia due to dehydration and poor oral intake, and hypokalemia. Workup negative for infection. Palliative medicine consultation for goals of care.     Assessment: End-stage dementia Severe dehydration Hypotension AKI,  resolved Hypernatremia Hypomagnesemia Hypophosphatemia Vit D deficiency Weakness/deconditioning  Recommendations/Plan:  F/u GOC discussion with husband, patient's sister-in-law, and daughter-in-law.  DNR/DNI, NO feeding tube.  Understanding her condition and prognosis, husband is ready for shift to comfort measures and hospice facility placement. TOC team notified.  Comfort meds added to [facility].  Comfort feeds per patient/family request. Aspiration precautions.   Unrestricted visitor access.  Hopefully transfer  to hospice facility tomorrow 1/25 if bed available.    Code Status: DNR/DNI   Code Status Orders  (From admission, onward)         Start     Ordered   08/10/20 2140  Do not attempt resuscitation (DNR)  Continuous       Question Answer Comment  In the event of cardiac or respiratory ARREST Do not call a "code blue"   In the event of cardiac or respiratory ARREST Do not perform Intubation, CPR, defibrillation or ACLS   In the event of cardiac or respiratory ARREST Use medication by any route, position, wound care, and other measures to relive pain and suffering. May use oxygen, suction and manual treatment of airway obstruction as needed for comfort.      08/10/20 2143        Code Status History    This patient has a current code status but no historical code status.   Advance Care Planning Activity    Advance Directive Documentation   Flowsheet Row Most Recent Value  Type of Advance Directive Healthcare Power of Attorney, Living will  Pre-existing out of facility DNR order (yellow form or pink MOST form) -  "MOST" Form in Place? -       Prognosis:  Likely weeks if not less with end-stage dementia. Anticipate she will decline following discontinuation of IVF with very poor to no oral intake.  Discharge Planning:  Hospice facility  Care plan was discussed with Dr. Dwyane Dee, RN, husband, DIL, SIL, hospice liaison  Thank you for allowing the Palliative  Medicine Team to assist in the care of this patient.   Total Time 45 Prolonged Time Billed    no      Greater than 50%  of this time was spent counseling and coordinating care related to the above assessment and plan.  Ihor Dow, DNP, FNP-C Palliative Medicine Team  Phone: [phone] Fax: [phone]  Please contact Palliative Medicine Team phone at [phone] for questions and concerns.

## 2020-08-15 NOTE — Progress Notes (Addendum)
[facility] Liaison note:  New referral for [facility] hospice home received from [facility]. Patient information sent to referral. Hospice home eligibility has been confirmed.  Writer spoke via telephone to patient's husband Simona Huh to initiate education regarding hospice services, philosophy, team approach to care and current visitation policy with understanding voiced.  Unfortunately [facility] is unable to offer a bed today. MR. Sabra Heck and the hospital care team are aware.  Liaison to follow and update daily or sooner regarding bed availability. Thank you for the opportunity to be involved in the care of this patient and her family.  Flo Shanks BSN, RN, [place] [phone]

## 2020-08-15 NOTE — Progress Notes (Signed)
[facility] Progress Note  Patient: Karen Ross    LKG:[id number]  DOA: 08/10/2020     Date of Service: the patient was seen and examined on 08/15/2020  Chief Complaint  Patient presents with  . Altered Mental Status   Brief hospital course: Karen Ross is a 75 y.o. female with medical history significant of  Progressive dementia who resides at [facility] memory care weakness, refusal to take po with decrease po for several days.Please note patient is poor historian and history is taken from ED chart and husband. Per husband patient has increase agitation at NH and was said to have hit staff.  At that time per husband her valproic acid was increased. With this increase patient was noted to have increase falls. She was evaluated for her most recent falls one week ago with xray of her right hip  And knee which per husband was negative. Patient after falling and due to pain become more imoblie she was treated with tramadol for pain but this did not help. He valproic acid was titrated down however patient was noted to have increase intermittent agitation refusing to eat ,as well as getting agitated with personal care per husband.  Due to patient continued decrease intake, progressive weakness over the last several days and increase lethargy patient was transferred to ED for evaluation. Currently patient denies any pain but is not will to answer other questions at this time.  In ed patient was found to be relatively hypotensive,dehydrated with AKI, hypernatremia as well as hypokalemic. Patient is admitted for further treatment.   ED Course: Karen Ross, bp 97/57, rr18 sat 98%on ra Labs: Wbc: 10.9, hgb15,  mcv 101,plt 216 NA 151, K2.2, Cl103, bicarb 35, glu 91 cr 0.9 base /0.43 Mag 2.3, Lactic 2.4, CE 25, GU:[id number]  Respiratory panel pending EKG: nsr no hyperacute st -twave changes Cxr:No acute cardiopulmonary abnormality; however, patient markedly rotated. Recommend repeat PA and  lateral view of the chest. CT head: Generalized cerebral atrophy. chronic left occipital lobe infarct. No acute intracranial abnormality.    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 -palliative care consult for assistance with goals of care  -hold sedating medications -valproic acid level 45 low   Severe Dehydration due to for po intake related to dementia  Leading to hypotension and AKI Continue IV fluid for hydration  Hypotension due to volume depletion, resolved s/p IVF - no sign of infection, UA negative, urine culture negative, COVID-negative   Hypertension, presented with low BP on admission but elevated BP now Started amlodipine as blood pressure is elevated now 1/21 started clonidine patch as patient may not take oral medications   AKI, resolved   -due to volume depletion from poor intake -hold nephrotoxic medications  - strict I/o   Hypernatremia most likely due to dehydration, resolved -Continue IV fluid for hydration -due to volume depletion   Hypokalemia -recheck labs  - replete prn   Hypomagnesemia, mag repleted.  Monitor and replete as needed  Hypophosphatemia, phos repleted.  Monitor and replete as needed.   Vitamin D deficiency, started vitamin D 50,000 units p.o. weekly for total 8 weeks.  Body mass index is 25.4 kg/m.     Overall prognosis is very poor, palliative care consulted and discussed with patient's husband, they are leaning forward towards hospice option but would like to discuss with other family members.   In the meantime we will continue current treatment and IV fluids    Diet:  Dysphagia 1 honey thick liquids DVT Prophylaxis: Subcutaneous Lovenox   Advance goals of care discussion: DNR  Family Communication: family was not present at bedside, at the time of interview.  The pt provided permission to discuss medical plan with the family. Opportunity was given to ask  question and all questions were answered satisfactorily.  1/24 today nobody was available at bedside  Disposition:  Pt is from [facility] memory care unit, admitted with severe dementia decreased oral intake, dehydration, hypotension, still has decreased oral intake secondary to severe dementia, which precludes a safe discharge. Discharge to memory care unit vs hospice care when family will decide. Palliative care discussed with the family, they are leaning forward to hospice but husband will discuss with other family members.   Subjective: No significant overnight issues, patient has severe dementia AAO x zero, unable to offer any complaints. Patient seems to be resting comfortably without any acute distress. No family member was available at bedside In the meantime we will continue above treatment and will follow palliative care discussed with family for hospice tomorrow again  Physical Exam: General:  alert not oriented to time, place, and person.  Appear in mild distress, affect flat in affect Eyes: PERRLA ENT: Oral Mucosa Clear, moist  Neck: no JVD,  Cardiovascular: S1 and S2 Present, no Murmur,  Respiratory: good respiratory effort, Bilateral Air entry equal and Decreased, no Crackles, no wheezes Abdomen: Bowel Sound present, Soft and no tenderness,  Skin: no rashes  Extremities: no Pedal edema, no calf tenderness Neurologic: without any new focal findings Gait not checked due to patient safety concerns  Vitals:   08/14/20 1735 08/14/20 2019 08/14/20 2328 08/15/20 0356  BP: (!) 142/79 (!) 144/90 132/83 (!) 149/77  Pulse: 79 86 85 84  Resp: 20     Temp: (!) 97.5 F (36.4 C) 97.8 F (36.6 C) 97.8 F (36.6 C) 98 F (36.7 C)  TempSrc: Oral Oral Oral Oral  SpO2: 94% 95% 94% 92%  Weight:      Height:        Intake/Output Summary (Last 24 hours) at 08/15/2020 1426 Last data filed at 08/14/2020 1610 Gross per 24 hour  Intake 560 ml  Output -  Net 560 ml   Filed Weights    08/12/20 0400 08/13/20 0400 08/14/20 0500  Weight: 40.8 kg 39.4 kg 40.1 kg    Data Reviewed: I have personally reviewed and interpreted daily labs, tele strips, imagings as discussed above. I reviewed all nursing notes, pharmacy notes, vitals, pertinent old records I have discussed plan of care as described above with RN and patient/family.  CBC: Recent Labs  Lab 08/10/20 1548 08/10/20 2338 08/11/20 0641 08/12/20 0404 08/13/20 0415 08/14/20 0433 08/15/20 0709  WBC 10.3   < > 9.3 9.0 10.4 10.1 10.6*  NEUTROABS 7.5  --   --   --   --   --   --   HGB 13.8   < > 14.1 13.6 13.0 12.2 11.4*  HCT 44.1   < > 43.5 41.9 37.4 34.6* 33.5*  MCV 103.8*   < > 99.1 98.6 94.7 94.3 96.5  PLT 197   < > 167 163 120* 108* 122*   < > = values in this interval not displayed.   Basic Metabolic Panel: Recent Labs  Lab 08/10/20 2338 08/11/20 0215 08/11/20 0641 08/12/20 0404 08/13/20 0415 08/14/20 0433 08/15/20 0709  NA 152*   < > 150* 145 140 139 140  K 2.6*   < >  2.9* 3.2* 3.0* 3.7 4.2  CL 107   < > 108 104 101 102 107  CO2 34*   < > 32 29 29 29 25   GLUCOSE 100*   < > 84 93 125* 120* 107*  BUN 37*   < > 33* 20 8 <5* <5*  CREATININE 0.51   < > 0.52 0.33* <0.30* <0.30* <0.30*  CALCIUM 7.8*   < > 7.9* 8.2* 7.9* 7.9* 8.1*  MG 2.3  --   --  2.1 1.5* 2.1 1.7  PHOS 2.8  --   --  2.2* 2.5 2.8 2.6   < > = values in this interval not displayed.    Studies: No results found.  Scheduled Meds: . cloNIDine  0.2 mg Transdermal Weekly  . melatonin  6 mg Oral QPM  . sodium chloride flush  3 mL Intravenous Q12H   Continuous Infusions:  PRN Meds: acetaminophen, atropine, haloperidol lactate, LORazepam, morphine injection, morphine CONCENTRATE, ondansetron (ZOFRAN) IV  Time spent: 35 minutes  Author: Val Riles. MD [facility] 08/15/2020 2:26 PM  To reach On-call, see care teams to locate the attending and reach out to them via [email or web address]. If 7PM-7AM, please contact  night-coverage If you still have difficulty reaching the attending provider, please page the [facility] (Director on Call) for [facility] on amion for assistance.

## 2020-08-16 NOTE — Progress Notes (Signed)
PROGRESS NOTE    Karen Ross  PJK:[id number] DOB: 1945/10/06 DOA: 08/10/2020 PCP: Orvis Brill, [facility]  Brief Narrative:  75 year old female history significant for progressive dementia who lives at memory care unit presented with adult failure to thrive, poor p.o. intake.  Intermittent agitation also was reported.  Progressive weakness since admission.  Palliative care met with patient 08/15/2020 and discussed with family.  Decision was made to proceed with hospice home referral.  At time of this note transfer to hospice facility is pending.  Hospice liaison is aware and actively searching for bed.   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  No bed currently available  Severe Dehydration  Hypotension  Hypertension, AKI Hypernatremia most likely due to dehydration Hypokalemia Hypomagnesemia Hypophosphatemia Vitamin D deficiency  DVT prophylaxis: Comfort care Code Status: DNR Family Communication: None today.  Patient spouse was updated by hospice liaison Disposition Plan:Status is: Inpatient  Remains inpatient appropriate because:Unsafe d/c plan   Dispo: The patient is from: Home              Anticipated d/c is to: Hospice facility              Anticipated d/c date is: 1 day              Patient currently is medically stable to d/c.   Difficult to place patient No         Consultants:   Palliative  care  Procedures:   None  Antimicrobials:   None   Subjective: Seen and examined.  Laying in bed.  No visible distress.  Objective: Vitals:   08/14/20 2328 08/15/20 0356 08/15/20 2013 08/16/20 1117  BP: 132/83 (!) 149/77 (!) 157/80 140/66  Pulse: 85 84 76 87  Resp:   18 19  Temp: 97.8 F (36.6 C) 98 F (36.7 C) (!) 97.5 F (36.4 C) 98.3 F (36.8 C)  TempSrc: Oral Oral Oral Axillary  SpO2: 94% 92% 99% 97%  Weight:      Height:        Intake/Output Summary (Last 24 hours) at 08/16/2020 1157 Last data filed at 08/15/2020 2041 Gross per 24 hour  Intake 3 ml  Output --  Net 3 ml   Filed Weights   08/12/20 0400 08/13/20 0400 08/14/20 0500  Weight: 40.8 kg 39.4 kg 40.1 kg    Examination:  Limited exam due to comfort measures only status  General exam: No acute distress Respiratory system: Scattered crackles.  Normal work of breathing.  Room air Cardiovascular system: Regular rate and rhythm.  No murmurs     Data Reviewed: I have personally reviewed following labs and imaging studies  CBC: Recent Labs  Lab 08/10/20 1548 08/10/20 2338 08/11/20 0641 08/12/20 0404 08/13/20 0415 08/14/20 0433 08/15/20 0709  WBC 10.3   < > 9.3 9.0 10.4 10.1 10.6*  NEUTROABS 7.5  --   --   --   --   --   --   HGB  13.8   < > 14.1 13.6 13.0 12.2 11.4*  HCT 44.1   < > 43.5 41.9 37.4 34.6* 33.5*  MCV 103.8*   < > 99.1 98.6 94.7 94.3 96.5  PLT 197   < > 167 163 120* 108* 122*   < > = values in this interval not displayed.   Basic Metabolic Panel: Recent Labs  Lab 08/10/20 2338 08/11/20 0215 08/11/20 9604 08/12/20 0404 08/13/20 0415 08/14/20 0433 08/15/20 0709  NA 152*   < > 150* 145 140 139 140  K 2.6*   < > 2.9* 3.2* 3.0* 3.7 4.2  CL 107   < > 108 104 101 102 107  CO2 34*   < > 32 $Re'29 29 29 25  'wDT$ GLUCOSE 100*   < > 84 93 125* 120* 107*  BUN 37*   < > 33* 20 8 <5* <5*  CREATININE 0.51   < > 0.52 0.33* <0.30* <0.30* <0.30*  CALCIUM 7.8*   < > 7.9* 8.2* 7.9* 7.9* 8.1*   MG 2.3  --   --  2.1 1.5* 2.1 1.7  PHOS 2.8  --   --  2.2* 2.5 2.8 2.6   < > = values in this interval not displayed.   GFR: CrCl cannot be calculated (This lab value cannot be used to calculate CrCl because it is not a number: <0.30). Liver Function Tests: Recent Labs  Lab 08/10/20 1503 08/11/20 0641  AST 35 28  ALT 16 13  ALKPHOS 75 68  BILITOT 1.0 0.9  PROT 6.3* 5.2*  ALBUMIN 3.3* 2.7*   No results for input(s): LIPASE, AMYLASE in the last 168 hours. No results for input(s): AMMONIA in the last 168 hours. Coagulation Profile: No results for input(s): INR, PROTIME in the last 168 hours. Cardiac Enzymes: No results for input(s): CKTOTAL, CKMB, CKMBINDEX, TROPONINI in the last 168 hours. BNP (last 3 results) No results for input(s): PROBNP in the last 8760 hours. HbA1C: No results for input(s): HGBA1C in the last 72 hours. CBG: Recent Labs  Lab 08/12/20 0753 08/13/20 0742 08/14/20 0748 08/14/20 1147 08/14/20 1741  GLUCAP 80 126* 105* 109* 109*   Lipid Profile: No results for input(s): CHOL, HDL, LDLCALC, TRIG, CHOLHDL, LDLDIRECT in the last 72 hours. Thyroid Function Tests: No results for input(s): TSH, T4TOTAL, FREET4, T3FREE, THYROIDAB in the last 72 hours. Anemia Panel: No results for input(s): VITAMINB12, FOLATE, FERRITIN, TIBC, IRON, RETICCTPCT in the last 72 hours. Sepsis Labs: Recent Labs  Lab 08/10/20 1548 08/10/20 2338 08/11/20 0641 08/12/20 0404  PROCALCITON  --  <0.10 <0.10 <0.10  LATICACIDVEN 2.4*  --   --   --     Recent Results (from the past 240 hour(s))  SARS CORONAVIRUS 2 (TAT 6-24 HRS) Nasopharyngeal Nasopharyngeal Swab     Status: None   Collection Time: 08/10/20  3:49 PM   Specimen: Nasopharyngeal Swab  Result Value Ref Range Status   SARS Coronavirus 2 NEGATIVE NEGATIVE Final    Comment: (NOTE) SARS-CoV-2 target nucleic acids are NOT DETECTED.  The SARS-CoV-2 RNA is generally detectable in upper and lower respiratory specimens  during the acute phase of infection. Negative results do not preclude SARS-CoV-2 infection, do not rule out co-infections with other pathogens, and should not be used as the sole basis for treatment or other patient management decisions. Negative results must be combined with clinical observations, patient history, and epidemiological information. The expected result is Negative.  Fact Sheet for Patients: SugarRoll.be  Fact Sheet  for Healthcare Providers: https://www.woods-mathews.com/  This test is not yet approved or cleared by the [place] and  has been authorized for detection and/or diagnosis of SARS-CoV-2 by FDA under an Emergency Use Authorization (EUA). This EUA will remain  in effect (meaning this test can be used) for the duration of the COVID-19 declaration under Se ction 564(b)(1) of the Act, 21 U.S.C. section 360bbb-3(b)(1), unless the authorization is terminated or revoked sooner.  Performed at [facility], [place] [address] [place], [place] [address]   Urine culture     Status: None   Collection Time: 08/10/20  3:49 PM   Specimen: Urine, Random  Result Value Ref Range Status   Specimen Description   Final    URINE, RANDOM Performed at [facility], [address], [place], [place] [address]    Special Requests   Final    NONE Performed at [facility], [address], [place], [place] [address]    Culture   Final    NO GROWTH Performed at [facility], [place] [address], [place],  [address]    Report Status 08/12/2020 FINAL  Final         Radiology Studies: No results found.      Scheduled Meds: . cloNIDine  0.2 mg Transdermal Weekly  . melatonin  6 mg Oral QPM  . sodium chloride flush  3 mL Intravenous Q12H   Continuous Infusions:   LOS: 6 days    Time spent: 15 MIN    S[place] Ace, MD [facility] Pager [phone]  If 7PM-7AM,  please contact night-coverage 08/16/2020, 11:57 AM

## 2020-08-16 NOTE — Progress Notes (Signed)
Comfort measures in place. Pt resting. Husband at bedside. Callbell within reach.

## 2020-08-16 NOTE — Progress Notes (Addendum)
[facility]) Hospital Liaison RN note:  Visited with patient and spouse, Simona Huh in room to provide update. Patient is resting quietly in bed.  Unfortunately[facility]. Spouse and hospital care team is aware. [facility] Liaison will follow for room availability.  Please call with any hospice related question or concerns.  Thank you.  Zandra Abts, RN [facility] Liaison [phone]

## 2020-08-17 MED ORDER — MORPHINE SULFATE (PF) 2 MG/ML IV SOLN: 1.0000 mg | INTRAVENOUS | 0 refills | Status: AC | PRN

## 2020-08-17 MED ORDER — MELATONIN 3 MG PO TABS: 6.0000 mg | ORAL_TABLET | Freq: Every evening | ORAL | 0 refills | Status: AC

## 2020-08-17 MED ORDER — CLONIDINE 0.2 MG/24HR TD PTWK: 0.2000 mg | MEDICATED_PATCH | TRANSDERMAL | 12 refills | Status: AC

## 2020-08-17 MED ORDER — LORAZEPAM 0.5 MG PO TABS: 0.5000 mg | ORAL_TABLET | ORAL | 0 refills | Status: AC | PRN

## 2020-08-17 MED ORDER — MORPHINE SULFATE (CONCENTRATE) 10 MG/0.5ML PO SOLN: 5.0000 mg | ORAL | 0 refills | Status: AC | PRN

## 2020-08-17 MED ORDER — ATROPINE SULFATE 1 % OP SOLN: 2.0000 [drp] | Freq: Four times a day (QID) | OPHTHALMIC | 12 refills | Status: AC | PRN

## 2020-08-17 NOTE — TOC Transition Note (Signed)
Transition of Care [facility]) - CM/SW Discharge Note   Patient Details  Name: ADALIND WEITZ MRN: [id number] Date of Birth: [birth year]  Transition of [facility]) CM/SW Contact:  Candie Chroman, LCSW Phone Number: 08/17/2020, 12:11 PM   Clinical Narrative:  Patient has orders to discharge to the [facility] today. RN will call report. [facility] set up for 5:00. No further concerns. CSW singing off.   Final next level of care: [facility] Barriers to Discharge: Barriers Resolved   Patient Goals and CMS Choice     Choice offered to / list presented to : Spouse  Discharge Placement                Patient to be transferred to facility by: [facility] Name of family member notified: Priyal Musquiz Patient and family notified of of transfer: 08/17/20  Discharge Plan and Services                                     Social Determinants of Health (SDOH) Interventions     Readmission Risk Interventions No flowsheet data found.

## 2020-08-17 NOTE — Progress Notes (Addendum)
[facility]) Hospital Liaison RN note:  Spoke with spouse, Simona Huh to let him know that [facility] does have a bed to offer today. He will complete paperwork at the [facility] at 1pm and transport has been arranged for 5pm. I will fax the discharge summary once it is available. Hospital care team is aware.  Please call with any hospice related questions or concerns.  Thank you for the opportunity to participate in this patient's care.  Zandra Abts, RN [facility] Liaison  [phone]

## 2020-08-17 NOTE — Discharge Summary (Signed)
Physician Discharge Summary  Karen Ross LNL:[id number] DOB: 05-21-1946 DOA: 08/10/2020  PCP: Orvis Brill, [facility]  Admit date: 08/10/2020 Discharge date: 08/17/2020  Admitted From: Home Disposition: Residential hospice    Discharge Condition: Hospic CODE STATUS: DNR/comfort care Diet recommendation: Per hospice staff  Brief/Interim Summary: 75 year old female history significant for progressive dementia who lives at memory care unit presented with adult failure to thrive, poor p.o. intake.  Intermittent agitation also was reported.  Progressive weakness since admission.  Palliative care met with patient 08/15/2020 and discussed with family.  Decision was made to proceed with hospice home referral.  Hospice when available and patient accepted on 08/17/2020.  Discussed with hospice liaison and inpatient TOC representative.   Discharge Diagnoses:  Active Problems:   Goals of care, counseling/discussion   Dementia with behavioral disturbance (HCC)   Hypernatremia   Altered mental status   Hypokalemia   Adult failure to thrive   Palliative care by specialist   DNR (do not resuscitate)   Protein-calorie malnutrition, severe  Progressive Endstage dementia with poor alimentation/failure to thrive -palliative care consult for assistance with goals of care -Comfort measures initiated -All medications not focused on patient comfort have been discontinued -Focus on pain and anxiety control -Hospice referral sent.    Bed offer accepted on 08/17/2008  Severe Dehydration Hypotension  Hypertension, AKI Hypernatremia most likely due to dehydration Hypokalemia Hypomagnesemia Hypophosphatemia Vitamin D deficiency  Discharge Instructions  Discharge Instructions    Diet - low sodium heart healthy   Complete by: As directed    Increase activity slowly   Complete by: As directed      Allergies as of 08/17/2020      Reactions   Sulfonamide Derivatives     REACTION: UNSPECIFIED; pt states it "didn't agree with me"      Medication List    STOP taking these medications   acetaminophen 500 MG tablet Commonly known as: TYLENOL   atorvastatin 10 MG tablet Commonly known as: LIPITOR   divalproex 250 MG DR tablet Commonly known as: DEPAKOTE   donepezil 10 MG tablet Commonly known as: ARICEPT   lisinopril-hydrochlorothiazide 20-12.5 MG tablet Commonly known as: ZESTORETIC   risperiDONE 0.5 MG disintegrating tablet Commonly known as: RisperDAL M-TAB   risperiDONE 1 MG disintegrating tablet Commonly known as: RisperDAL M-TAB   sertraline 100 MG tablet Commonly known as: ZOLOFT   traMADol 50 MG tablet Commonly known as: ULTRAM     TAKE these medications   atropine 1 % ophthalmic solution Place 2 drops under the tongue 4 (four) times daily as needed (secretions).   cloNIDine 0.2 mg/24hr patch Commonly known as: CATAPRES - Dosed in mg/24 hr Place 1 patch (0.2 mg total) onto the skin once a week. Start taking on: August 19, 2020   LORazepam 0.5 MG tablet Commonly known as: ATIVAN Take 1 tablet (0.5 mg total) by mouth every 4 (four) hours as needed for anxiety.   melatonin 3 MG Tabs tablet Take 2 tablets (6 mg total) by mouth every evening.   morphine 2 MG/ML injection Inject 0.5 mLs (1 mg total) into the vein every 2 (two) hours as needed (dyspnea/air hunger/tachypnea).   morphine CONCENTRATE 10 MG/0.5ML Soln concentrated solution Place 0.25 mLs (5 mg total) under the tongue every 2 (two) hours as needed for moderate pain (dyspnea/air hunger/tachypnea).       Allergies  Allergen Reactions  . Sulfonamide Derivatives     REACTION: UNSPECIFIED; pt states it "didn't agree with me"  Consultations:  Palliative care   Procedures/Studies: CT Head Wo Contrast  Result Date: 08/10/2020 CLINICAL DATA:  Altered mental status. EXAM: CT HEAD WITHOUT CONTRAST TECHNIQUE: Contiguous axial images were obtained from the base  of the skull through the vertex without intravenous contrast. COMPARISON:  None. FINDINGS: Brain: There is moderate severity cerebral atrophy with widening of the extra-axial spaces and ventricular dilatation. There are areas of decreased attenuation within the white matter tracts of the supratentorial brain, consistent with microvascular disease changes. An area of cortical encephalomalacia, with adjacent chronic white matter low attenuation is seen within the left occipital lobe. Vascular: No hyperdense vessel or unexpected calcification. Skull: Normal. Negative for fracture or focal lesion. Sinuses/Orbits: No acute finding. Other: None. IMPRESSION: 1. Generalized cerebral atrophy. 2. Chronic left occipital lobe infarct. 3. No acute intracranial abnormality. Electronically Signed   By: Virgina Norfolk M.D.   On: 08/10/2020 16:18   DG Chest Port 1 View  Result Date: 08/10/2020 CLINICAL DATA:  Altered mental status. EXAM: PORTABLE CHEST 1 VIEW.  Patient markedly rotated. COMPARISON:  None. FINDINGS: The heart size and mediastinal contours are grossly unremarkable. No focal consolidation. No pulmonary edema. No pleural effusion. No pneumothorax. No acute osseous abnormality.  Breast implants noted. IMPRESSION: No acute cardiopulmonary abnormality; however, patient markedly rotated. Recommend repeat PA and lateral view of the chest. Electronically Signed   By: Iven Finn M.D.   On: 08/10/2020 16:12    (Echo, Carotid, EGD, Colonoscopy, ERCP)    Subjective: Patient seen and examined on the day of discharge.  Calm, no distress.  Can discharge to inpatient hospice facility.  Discharge Exam: Vitals:   08/16/20 1117 08/16/20 2035  BP: 140/66 (!) 149/77  Pulse: 87 79  Resp: 19 17  Temp: 98.3 F (36.8 C) 97.8 F (36.6 C)  SpO2: 97% 95%   Vitals:   08/15/20 2013 08/16/20 1117 08/16/20 2035 08/17/20 0500  BP: (!) 157/80 140/66 (!) 149/77   Pulse: 76 87 79   Resp: $Remo'18 19 17   'ISHUJ$ Temp: (!) 97.5 F  (36.4 C) 98.3 F (36.8 C) 97.8 F (36.6 C)   TempSrc: Oral Axillary Oral   SpO2: 99% 97% 95%   Weight:    36.2 kg  Height:        General: Sleeping, no apparent distress Cardiovascular: Regular rate and rhythm, no murmurs Respiratory: Poor respiratory effort.  Scattered crackles bilaterally.  Normal work of breathing.     The results of significant diagnostics from this hospitalization (including imaging, microbiology, ancillary and laboratory) are listed below for reference.     Microbiology: Recent Results (from the past 240 hour(s))  SARS CORONAVIRUS 2 (TAT 6-24 HRS) Nasopharyngeal Nasopharyngeal Swab     Status: None   Collection Time: 08/10/20  3:49 PM   Specimen: Nasopharyngeal Swab  Result Value Ref Range Status   SARS Coronavirus 2 NEGATIVE NEGATIVE Final    Comment: (NOTE) SARS-CoV-2 target nucleic acids are NOT DETECTED.  The SARS-CoV-2 RNA is generally detectable in upper and lower respiratory specimens during the acute phase of infection. Negative results do not preclude SARS-CoV-2 infection, do not rule out co-infections with other pathogens, and should not be used as the sole basis for treatment or other patient management decisions. Negative results must be combined with clinical observations, patient history, and epidemiological information. The expected result is Negative.  Fact Sheet for Patients: SugarRoll.be  Fact Sheet for Healthcare Providers: https://www.woods-mathews.com/  This test is not yet approved or cleared by the [place]  States FDA and  has been authorized for detection and/or diagnosis of SARS-CoV-2 by FDA under an Emergency Use Authorization (EUA). This EUA will remain  in effect (meaning this test can be used) for the duration of the COVID-19 declaration under Se ction 564(b)(1) of the Act, 21 U.S.C. section 360bbb-3(b)(1), unless the authorization is terminated or revoked sooner.  Performed  at [facility], [place] [address], [place], [place] [address]   Urine culture     Status: None   Collection Time: 08/10/20  3:49 PM   Specimen: Urine, Random  Result Value Ref Range Status   Specimen Description   Final    URINE, RANDOM Performed at [facility], [address], [place], [place] [address]    Special Requests   Final    NONE Performed at [facility], [address], [place],  [address]    Culture   Final    NO GROWTH Performed at [facility], [place] [address] [place],  [address]    Report Status 08/12/2020 FINAL  Final     Labs: BNP (last 3 results) No results for input(s): BNP in the last 8760 hours. Basic Metabolic Panel: Recent Labs  Lab 08/10/20 2338 08/11/20 0215 08/11/20 7026 08/12/20 0404 08/13/20 0415 08/14/20 0433 08/15/20 0709  NA 152*   < > 150* 145 140 139 140  K 2.6*   < > 2.9* 3.2* 3.0* 3.7 4.2  CL 107   < > 108 104 101 102 107  CO2 34*   < > 32 $Re'29 29 29 25  'zTi$ GLUCOSE 100*   < > 84 93 125* 120* 107*  BUN 37*   < > 33* 20 8 <5* <5*  CREATININE 0.51   < > 0.52 0.33* <0.30* <0.30* <0.30*  CALCIUM 7.8*   < > 7.9* 8.2* 7.9* 7.9* 8.1*  MG 2.3  --   --  2.1 1.5* 2.1 1.7  PHOS 2.8  --   --  2.2* 2.5 2.8 2.6   < > = values in this interval not displayed.   Liver Function Tests: Recent Labs  Lab 08/10/20 1503 08/11/20 0641  AST 35 28  ALT 16 13  ALKPHOS 75 68  BILITOT 1.0 0.9  PROT 6.3* 5.2*  ALBUMIN 3.3* 2.7*   No results for input(s): LIPASE, AMYLASE in the last 168 hours. No results for input(s): AMMONIA in the last 168 hours. CBC: Recent Labs  Lab 08/10/20 1548 08/10/20 2338 08/11/20 0641 08/12/20 0404 08/13/20 0415 08/14/20 0433 08/15/20 0709  WBC 10.3   < > 9.3 9.0 10.4 10.1 10.6*  NEUTROABS 7.5  --   --   --   --   --   --   HGB 13.8   < > 14.1 13.6 13.0 12.2 11.4*  HCT 44.1   < > 43.5 41.9 37.4 34.6* 33.5*  MCV 103.8*   < > 99.1 98.6 94.7 94.3 96.5  PLT 197   < > 167 163  120* 108* 122*   < > = values in this interval not displayed.   Cardiac Enzymes: No results for input(s): CKTOTAL, CKMB, CKMBINDEX, TROPONINI in the last 168 hours. BNP: Invalid input(s): POCBNP CBG: Recent Labs  Lab 08/12/20 0753 08/13/20 0742 08/14/20 0748 08/14/20 1147 08/14/20 1741  GLUCAP 80 126* 105* 109* 109*   D-Dimer No results for input(s): DDIMER in the last 72 hours. Hgb A1c No results for input(s): HGBA1C in the last 72 hours. Lipid  Profile No results for input(s): CHOL, HDL, LDLCALC, TRIG, CHOLHDL, LDLDIRECT in the last 72 hours. Thyroid function studies No results for input(s): TSH, T4TOTAL, T3FREE, THYROIDAB in the last 72 hours.  Invalid input(s): FREET3 Anemia work up No results for input(s): VITAMINB12, FOLATE, FERRITIN, TIBC, IRON, RETICCTPCT in the last 72 hours. Urinalysis    Component Value Date/Time   COLORURINE AMBER (A) 08/10/2020 1549   APPEARANCEUR HAZY (A) 08/10/2020 1549   LABSPEC 1.030 08/10/2020 1549   PHURINE 5.0 08/10/2020 1549   GLUCOSEU NEGATIVE 08/10/2020 1549   HGBUR NEGATIVE 08/10/2020 1549   HGBUR large 01/28/2007 1405   BILIRUBINUR NEGATIVE 08/10/2020 1549   KETONESUR 5 (A) 08/10/2020 1549   PROTEINUR 30 (A) 08/10/2020 1549   UROBILINOGEN 0.2 01/28/2007 1405   NITRITE NEGATIVE 08/10/2020 1549   LEUKOCYTESUR NEGATIVE 08/10/2020 1549   Sepsis Labs Invalid input(s): PROCALCITONIN,  WBC,  LACTICIDVEN Microbiology Recent Results (from the past 240 hour(s))  SARS CORONAVIRUS 2 (TAT 6-24 HRS) Nasopharyngeal Nasopharyngeal Swab     Status: None   Collection Time: 08/10/20  3:49 PM   Specimen: Nasopharyngeal Swab  Result Value Ref Range Status   SARS Coronavirus 2 NEGATIVE NEGATIVE Final    Comment: (NOTE) SARS-CoV-2 target nucleic acids are NOT DETECTED.  The SARS-CoV-2 RNA is generally detectable in upper and lower respiratory specimens during the acute phase of infection. Negative results do not preclude SARS-CoV-2  infection, do not rule out co-infections with other pathogens, and should not be used as the sole basis for treatment or other patient management decisions. Negative results must be combined with clinical observations, patient history, and epidemiological information. The expected result is Negative.  Fact Sheet for Patients: SugarRoll.be  Fact Sheet for Healthcare Providers: https://www.woods-mathews.com/  This test is not yet approved or cleared by the [place] FDA and  has been authorized for detection and/or diagnosis of SARS-CoV-2 by FDA under an Emergency Use Authorization (EUA). This EUA will remain  in effect (meaning this test can be used) for the duration of the COVID-19 declaration under Se ction 564(b)(1) of the Act, 21 U.S.C. section 360bbb-3(b)(1), unless the authorization is terminated or revoked sooner.  Performed at [facility], [place] [address] [place], [place] [address]   Urine culture     Status: None   Collection Time: 08/10/20  3:49 PM   Specimen: Urine, Random  Result Value Ref Range Status   Specimen Description   Final    URINE, RANDOM Performed at [facility], [address] [place], [place] [address]    Special Requests   Final    NONE Performed at [facility], [address] [place], [place] [address]    Culture   Final    NO GROWTH Performed at [facility], [place] [address] [place], [place] [address]    Report Status 08/12/2020 FINAL  Final     Time coordinating discharge: Over 30 minutes  SIGNED:   S[place] Ace, MD  [facility] 08/17/2020, 11:59 AM Pager   If 7PM-7AM, please contact night-coverage

## 2020-08-23 DEATH — deceased

## 2023-01-19 IMAGING — CT CT HEAD W/O CM
3 series · 16 of 47 positions shown, 19 images · non-contrast
Comparison: None.

CLINICAL DATA: Altered mental status.

EXAM:
CT HEAD WITHOUT CONTRAST
TECHNIQUE: Contiguous axial images were obtained from the base of the skull
through the vertex without intravenous contrast.

[Series 2: head wo · axial · 0.44mm/px · z∈[-154,-29]mm · 10 of 31 slices shown, 13 images]
[im 3/31  brain]
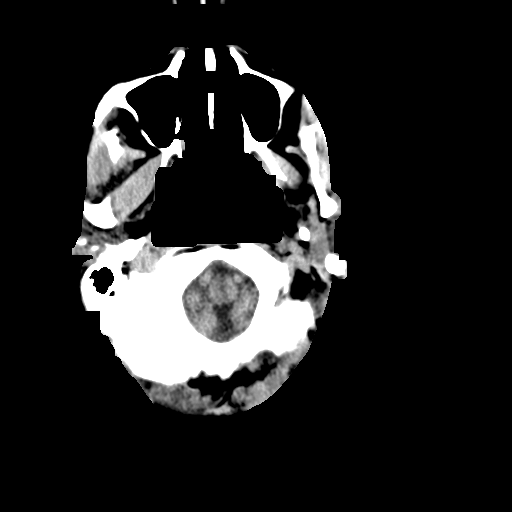
[im 3/31  bone]
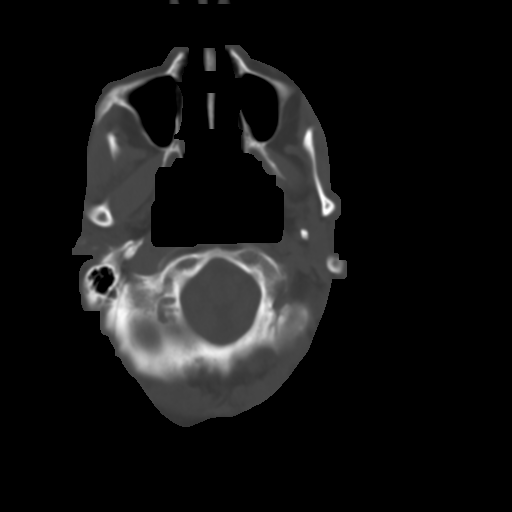
[im 6/31  brain]
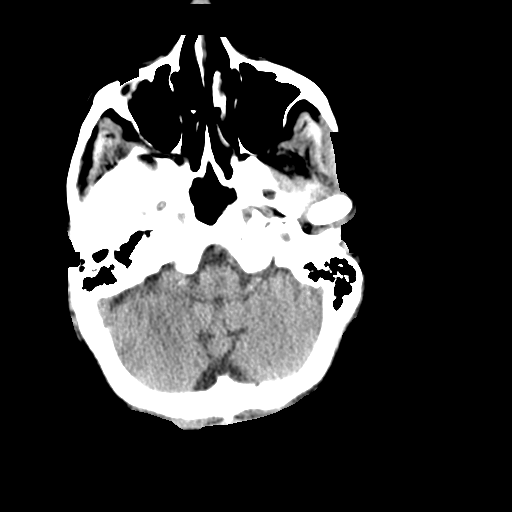
[im 9/31  brain]
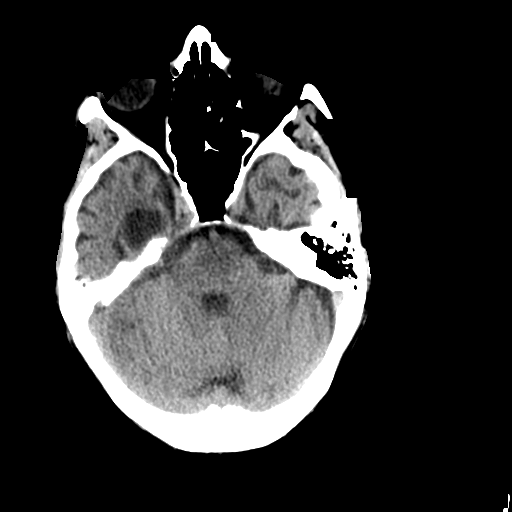
[im 11/31  brain]
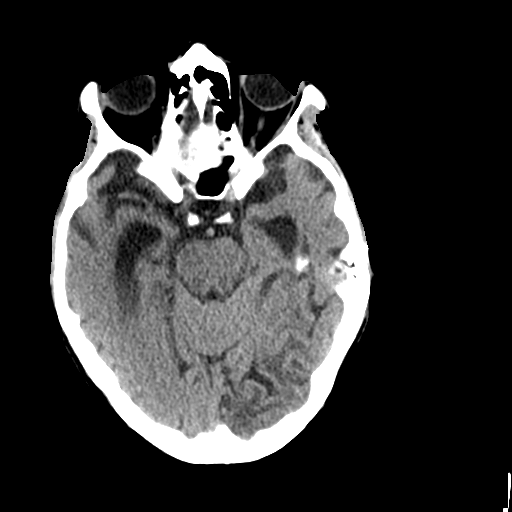
[im 14/31  brain]
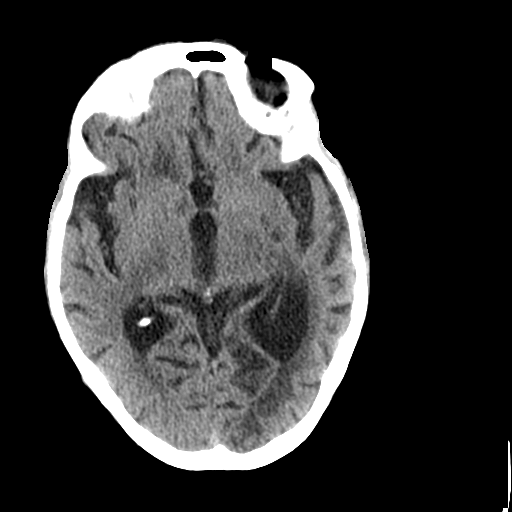
[im 14/31  bone]
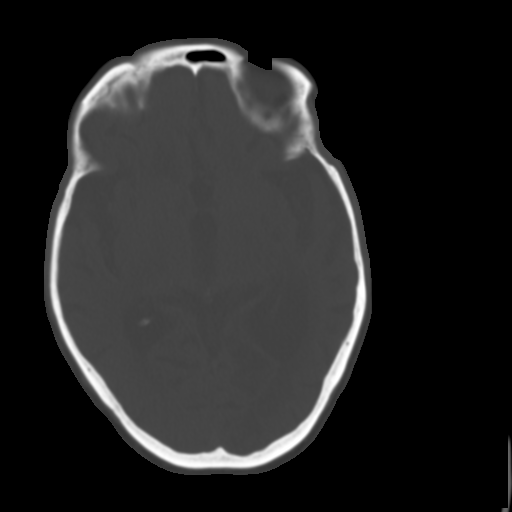
[im 17/31  brain]
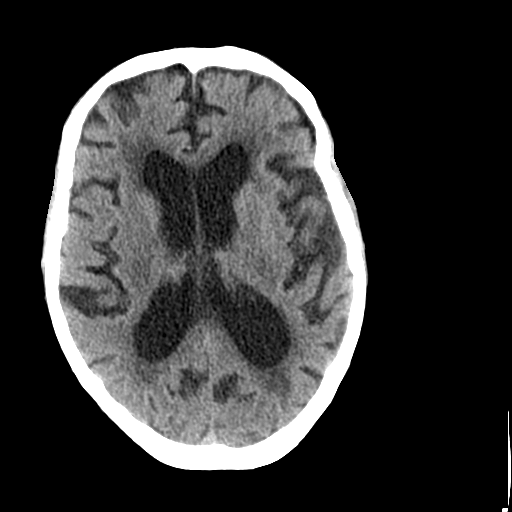
[im 20/31  brain]
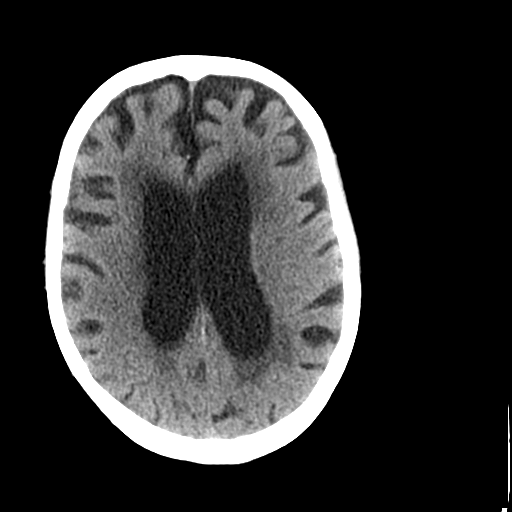
[im 23/31  brain]
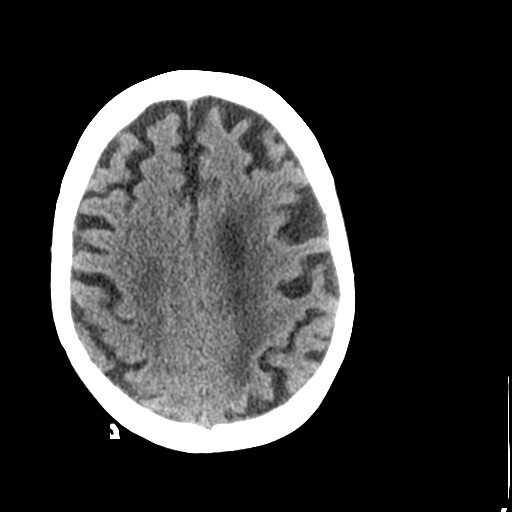
[im 25/31  brain]
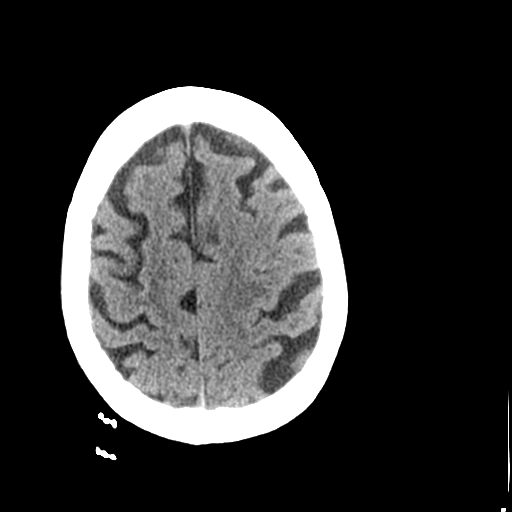
[im 25/31  bone]
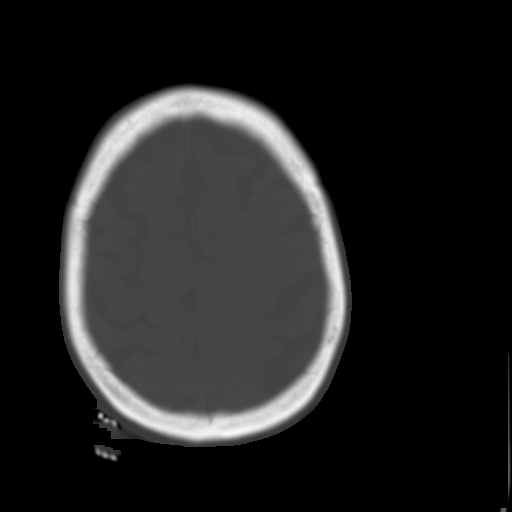
[im 28/31  brain]
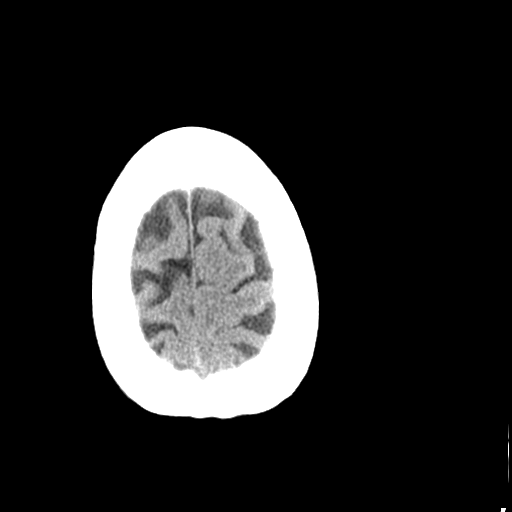

[Series 4: coronal soft tissue · coronal · 0.32mm/px · 3 of 69 slices shown]
[im 25/69  brain]
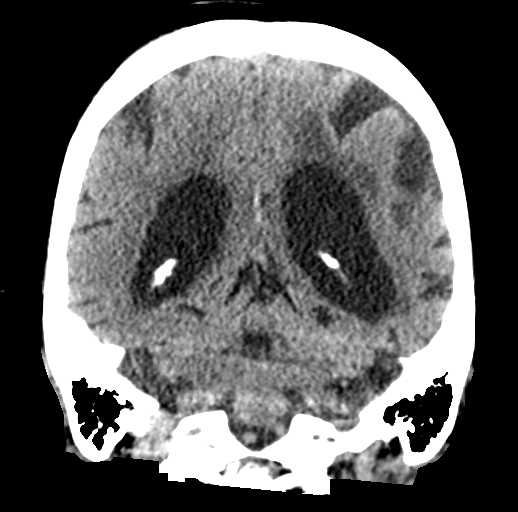
[im 31/69  brain]
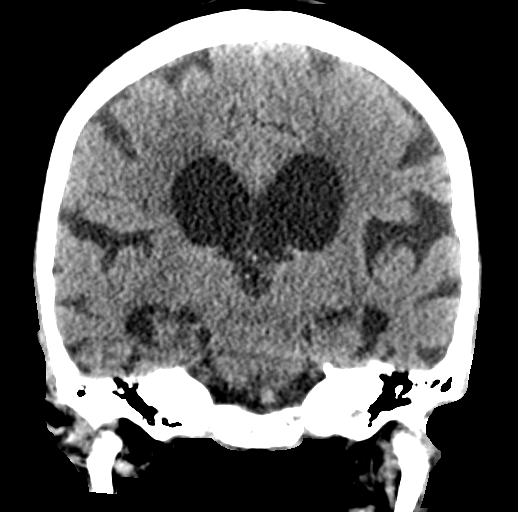
[im 38/69  brain]
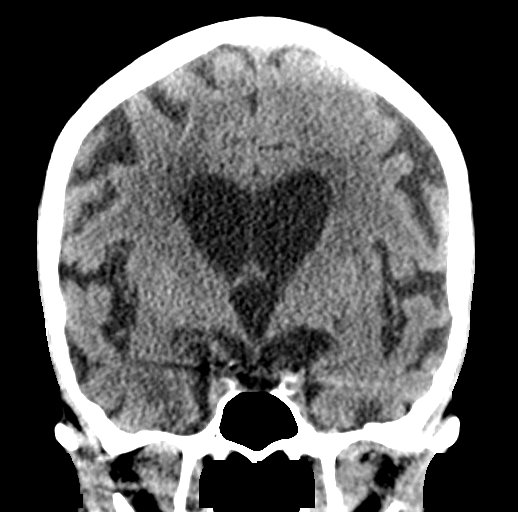

[Series 5: sagittal soft tissue · sagittal · 0.32mm/px · 3 of 52 slices shown]
[im 18/52  brain]
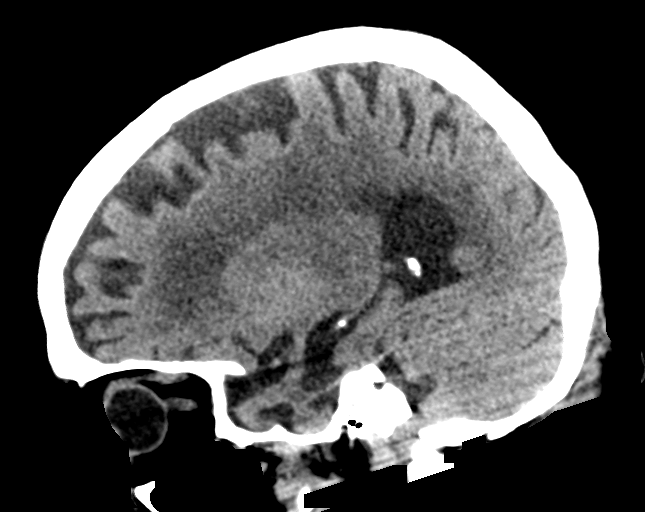
[im 26/52  brain]
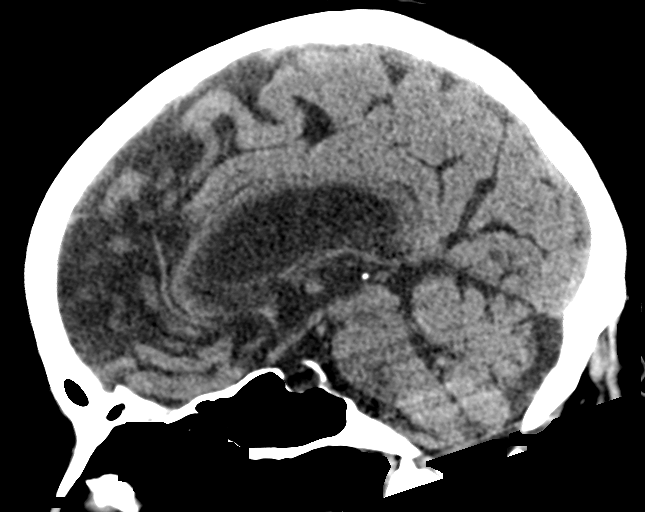
[im 35/52  brain]
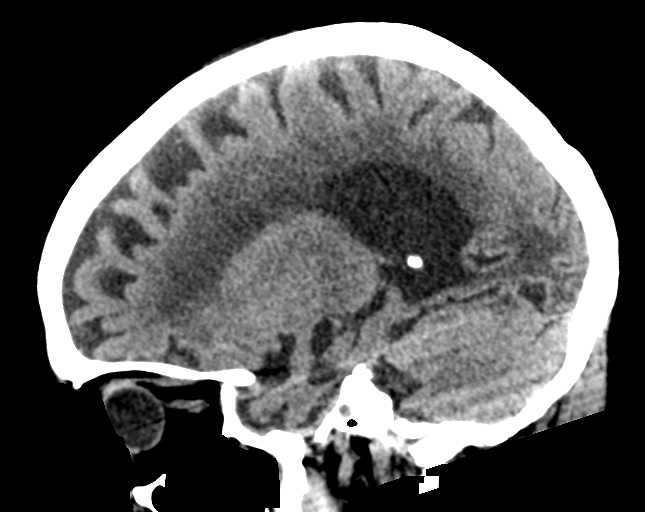

[16 of 47 positions shown; findings below may reference images not displayed]

FINDINGS: Brain: There is moderate severity cerebral atrophy with widening of
the extra-axial spaces and ventricular dilatation.
There are areas of decreased attenuation within the white matter
tracts of the supratentorial brain, consistent with microvascular
disease changes.

An area of cortical encephalomalacia, with adjacent chronic white
matter low attenuation is seen within the left occipital lobe.

Vascular: No hyperdense vessel or unexpected calcification.

Skull: Normal. Negative for fracture or focal lesion.

Sinuses/Orbits: No acute finding.

Other: None.
IMPRESSION: 1. Generalized cerebral atrophy.
2. Chronic left occipital lobe infarct.
3. No acute intracranial abnormality.

## 2023-01-19 IMAGING — CR DG CHEST 1V PORT
1 series · 1 of 1 positions shown · non-contrast
Comparison: None.

CLINICAL DATA: Altered mental status.

EXAM:
PORTABLE CHEST 1 VIEW.  Patient markedly rotated.

[dg chest port 1 view]
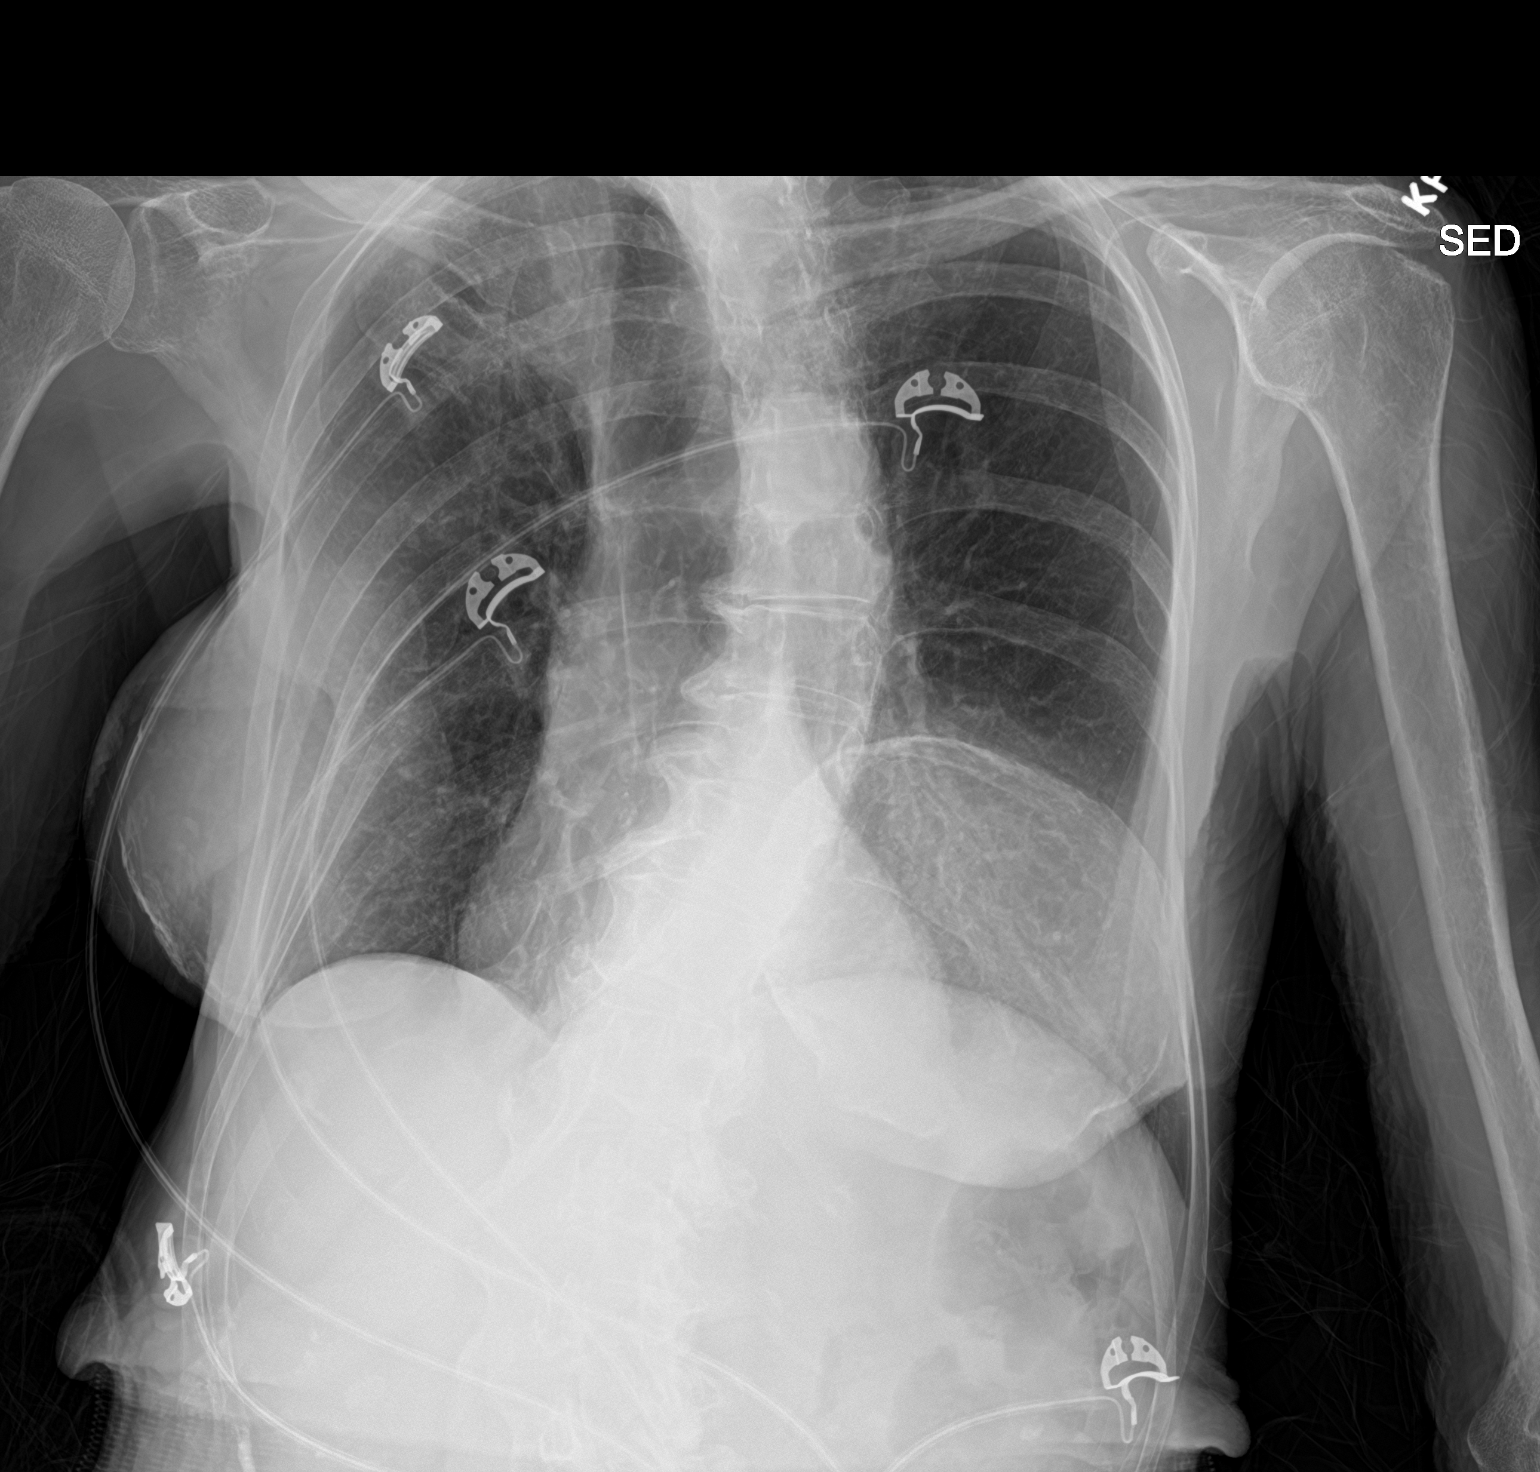

[1 of 1 positions shown; findings below may reference images not displayed]

FINDINGS: The heart size and mediastinal contours are grossly unremarkable.

No focal consolidation. No pulmonary edema. No pleural effusion. No
pneumothorax.

No acute osseous abnormality.  Breast implants noted.
IMPRESSION: No acute cardiopulmonary abnormality; however, patient markedly
rotated. Recommend repeat PA and lateral view of the chest.
